# Patient Record
Sex: Female | Born: 1995 | Hispanic: Yes | Marital: Married | Smoking: Never smoker
Health system: Southern US, Community
[De-identification: ages and names within clinical notes are randomized; demographics above are authoritative.]

## PROBLEM LIST (undated history)

## (undated) ENCOUNTER — Emergency Department (HOSPITAL_COMMUNITY): Payer: Medicaid Other

## (undated) DIAGNOSIS — T8859XA Other complications of anesthesia, initial encounter: Secondary | ICD-10-CM

## (undated) DIAGNOSIS — T4145XA Adverse effect of unspecified anesthetic, initial encounter: Secondary | ICD-10-CM

## (undated) DIAGNOSIS — Z8759 Personal history of other complications of pregnancy, childbirth and the puerperium: Secondary | ICD-10-CM

---
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
## 2016-07-12 DIAGNOSIS — Z3046 Encounter for surveillance of implantable subdermal contraceptive: Secondary | ICD-10-CM | POA: Diagnosis not present

## 2016-07-12 DIAGNOSIS — Z113 Encounter for screening for infections with a predominantly sexual mode of transmission: Secondary | ICD-10-CM | POA: Diagnosis not present

## 2016-07-12 DIAGNOSIS — Z30013 Encounter for initial prescription of injectable contraceptive: Secondary | ICD-10-CM | POA: Diagnosis not present

## 2017-03-24 ENCOUNTER — Encounter (HOSPITAL_COMMUNITY): Payer: Self-pay | Admitting: Emergency Medicine

## 2017-03-24 ENCOUNTER — Ambulatory Visit (HOSPITAL_COMMUNITY)
Admission: EM | Admit: 2017-03-24 | Discharge: 2017-03-24 | Disposition: A | Discharge status: Home / Self Care | Payer: Self-pay | Attending: Family Medicine | Admitting: Family Medicine

## 2017-03-24 DIAGNOSIS — H9201 Otalgia, right ear: Secondary | ICD-10-CM

## 2017-03-24 DIAGNOSIS — J029 Acute pharyngitis, unspecified: Secondary | ICD-10-CM | POA: Insufficient documentation

## 2017-03-24 LAB — POCT RAPID STREP A: STREPTOCOCCUS, GROUP A SCREEN (DIRECT): NEGATIVE

## 2017-03-24 MED ORDER — FLUTICASONE PROPIONATE 50 MCG/ACT NA SUSP: 2.0000 | Freq: Every day | NASAL | 0 refills | Status: DC

## 2017-03-24 MED ORDER — PHENOL 1.4 % MT LIQD
1.0000 | OROMUCOSAL | 0 refills | Status: DC | PRN
Start: 2017-03-24 — End: 2019-10-06

## 2017-03-24 MED ORDER — CETIRIZINE-PSEUDOEPHEDRINE ER 5-120 MG PO TB12: 1.0000 | ORAL_TABLET | Freq: Every day | ORAL | 0 refills | Status: DC

## 2017-03-24 NOTE — ED Provider Notes (Signed)
[facility]    CSN: [id number] Arrival date & time: 03/24/17  1458     History   Chief Complaint Chief Complaint  Patient presents with  . Sore Throat  . Otalgia    HPI Janet Rivers is a 22 y.o. female.   22 year old female comes in for 2-week history of sore throat and intermittent right ear pain. States sore throat is intermittent, worse with eating, worse first thing in the morning. Denies rhinorrhea, nasal congestion, cough. Denies fever, chills, night sweats. otc cold medications with some relief of ear pain. Ibuprofen without relief. Never smoker, denies sick contact.       History reviewed. No pertinent past medical history.  There are no active problems to display for this patient.   Past Surgical History:  Procedure Laterality Date  . CESAREAN SECTION      OB History    No data available       Home Medications    Prior to Admission medications   Medication Sig Start Date End Date Taking? Authorizing Provider  cetirizine-pseudoephedrine (ZYRTEC-D) 5-120 MG tablet Take 1 tablet by mouth daily. 03/24/17   Cathie HoopsYu, Kari Kerth V, PA-C  fluticasone (FLONASE) 50 MCG/ACT nasal spray Place 2 sprays into both nostrils daily. 03/24/17   Cathie HoopsYu, Elizeth Weinrich V, PA-C  phenol (CHLORASEPTIC) 1.4 % LIQD Use as directed 1 spray in the mouth or throat as needed for throat irritation / pain. 03/24/17   Belinda FisherYu, Karlen Barbar V, PA-C    Family History No family history on file.  Social History Social History   Tobacco Use  . Smoking status: Never Smoker  . Smokeless tobacco: Never Used  Substance Use Topics  . Alcohol use: Yes    Comment: occ.   . Drug use: No     Allergies   Patient has no known allergies.   Review of Systems Review of Systems  Reason unable to perform ROS: See HPI as above.     Physical Exam Triage Vital Signs ED Triage Vitals [03/24/17 1545]  Enc Vitals Group     BP 104/65     Pulse Rate 92     Resp 16     Temp 98.6 F (37 C)     Temp Source Temporal   SpO2 98 %     Weight 120 lb (54.4 kg)     Height 5\' 4"  (1.626 m)     Head Circumference      Peak Flow      Pain Score 0     Pain Loc      Pain Edu?      Excl. in GC?    No data found.  Updated Vital Signs BP 104/65   Pulse 92   Temp 98.6 F (37 C) (Temporal)   Resp 16   Ht 5\' 4"  (1.626 m)   Wt 120 lb (54.4 kg)   LMP 03/10/2017   SpO2 98%   BMI 20.60 kg/m   Physical Exam  Constitutional: She is oriented to person, place, and time. She appears well-developed and well-nourished. No distress.  HENT:  Head: Normocephalic and atraumatic.  Right Ear: External ear and ear canal normal. Tympanic membrane is erythematous. Tympanic membrane is not bulging.  Left Ear: External ear and ear canal normal. Tympanic membrane is erythematous. Tympanic membrane is not bulging.  Nose: Nose normal. Right sinus exhibits no maxillary sinus tenderness and no frontal sinus tenderness. Left sinus exhibits no maxillary sinus tenderness and no frontal sinus tenderness.  Mouth/Throat:  Uvula is midline and mucous membranes are normal. Posterior oropharyngeal erythema present. Tonsils are 1+ on the right. Tonsils are 1+ on the left. No tonsillar exudate.  Eyes: Conjunctivae are normal. Pupils are equal, round, and reactive to light.  Neck: Normal range of motion. Neck supple.  Cardiovascular: Normal rate, regular rhythm and normal heart sounds. Exam reveals no gallop and no friction rub.  No murmur heard. Pulmonary/Chest: Effort normal and breath sounds normal. She has no decreased breath sounds. She has no wheezes. She has no rhonchi. She has no rales.  Lymphadenopathy:    She has no cervical adenopathy.  Neurological: She is alert and oriented to person, place, and time.  Skin: Skin is warm and dry.  Psychiatric: She has a normal mood and affect. Her behavior is normal. Judgment normal.     UC Treatments / Results  Labs (all labs ordered are listed, but only abnormal results are displayed) Labs  Reviewed  CULTURE, GROUP A STREP [facility])  POCT RAPID STREP A    EKG  EKG Interpretation None       Radiology No results found.  Procedures Procedures (including critical care time)  Medications Ordered in UC Medications - No data to display   Initial Impression / Assessment and Plan / UC Course  I have reviewed the triage vital signs and the nursing notes.  Pertinent labs & imaging results that were available during my care of the patient were reviewed by me and considered in my medical decision making (see chart for details).    Rapid strep negative. Symptomatic treatment as needed. Return precautions given.   Final Clinical Impressions(s) / UC Diagnoses   Final diagnoses:  Pharyngitis, unspecified etiology    ED Discharge Orders        Ordered    fluticasone (FLONASE) 50 MCG/ACT nasal spray  Daily     03/24/17 1712    cetirizine-pseudoephedrine (ZYRTEC-D) 5-120 MG tablet  Daily     03/24/17 1712    phenol (CHLORASEPTIC) 1.4 % LIQD  As needed     03/24/17 1712        Belinda Fisher, PA-C 03/24/17 1731

## 2017-03-24 NOTE — ED Triage Notes (Signed)
PT reports sore throat for 2 weeks. PT denies fever.   PT reports intermittent right ear pain for same time period.

## 2017-03-24 NOTE — Discharge Instructions (Signed)
Rapid strep negative. Symptoms are most likely due to viral illness. Start phenol for sore throat. Flonase and/or Zyrtec-D for nasal congestion. You can use over the counter nasal saline rinse such as neti pot for nasal congestion. Monitor for any worsening of symptoms, swelling of the throat, trouble breathing, trouble swallowing, follow up for reevaluation.  ° °For sore throat try using a honey-based tea. Use 3 teaspoons of honey with juice squeezed from half lemon. Place shaved pieces of ginger into 1/2-1 cup of water and warm over stove top. Then mix the ingredients and repeat every 4 hours as needed. ° ° °

## 2017-03-27 LAB — CULTURE, GROUP A STREP (THRC)

## 2018-08-17 HISTORY — PX: COSMETIC SURGERY: SHX468

## 2019-03-19 HISTORY — DX: Maternal care for unspecified type scar from previous cesarean delivery: O34.219

## 2019-05-19 ENCOUNTER — Inpatient Hospital Stay (HOSPITAL_COMMUNITY)
Admission: AD | Admit: 2019-05-19 | Discharge: 2019-05-20 | Disposition: A | Discharge status: Home / Self Care | Payer: Medicaid Other | Attending: Obstetrics and Gynecology | Admitting: Obstetrics and Gynecology

## 2019-05-19 ENCOUNTER — Encounter (HOSPITAL_COMMUNITY): Payer: Self-pay | Admitting: *Deleted

## 2019-05-19 ENCOUNTER — Other Ambulatory Visit: Payer: Self-pay

## 2019-05-19 DIAGNOSIS — Z6791 Unspecified blood type, Rh negative: Secondary | ICD-10-CM | POA: Insufficient documentation

## 2019-05-19 DIAGNOSIS — Z3A09 9 weeks gestation of pregnancy: Secondary | ICD-10-CM | POA: Insufficient documentation

## 2019-05-19 DIAGNOSIS — O3680X0 Pregnancy with inconclusive fetal viability, not applicable or unspecified: Secondary | ICD-10-CM

## 2019-05-19 DIAGNOSIS — Z3A00 Weeks of gestation of pregnancy not specified: Secondary | ICD-10-CM | POA: Diagnosis not present

## 2019-05-19 DIAGNOSIS — O209 Hemorrhage in early pregnancy, unspecified: Secondary | ICD-10-CM | POA: Diagnosis not present

## 2019-05-19 DIAGNOSIS — O26891 Other specified pregnancy related conditions, first trimester: Secondary | ICD-10-CM | POA: Insufficient documentation

## 2019-05-19 DIAGNOSIS — O208 Other hemorrhage in early pregnancy: Secondary | ICD-10-CM | POA: Insufficient documentation

## 2019-05-19 MED ORDER — SODIUM CHLORIDE 0.9% FLUSH: 3.0000 mL | Freq: Once | INTRAVENOUS | Status: DC

## 2019-05-19 NOTE — ED Triage Notes (Signed)
THE PT IS C/O VAGINAL BLEEDING THAT STARTED 2 DAYS AGO  NO PAIN  LMP DEC 27TH 2ND PREGNANCY NO PRENATAL CARE YET

## 2019-05-20 ENCOUNTER — Encounter (HOSPITAL_COMMUNITY): Payer: Self-pay | Admitting: *Deleted

## 2019-05-20 ENCOUNTER — Inpatient Hospital Stay (HOSPITAL_COMMUNITY): Payer: Medicaid Other

## 2019-05-20 DIAGNOSIS — O209 Hemorrhage in early pregnancy, unspecified: Secondary | ICD-10-CM | POA: Diagnosis not present

## 2019-05-20 DIAGNOSIS — O3680X0 Pregnancy with inconclusive fetal viability, not applicable or unspecified: Secondary | ICD-10-CM | POA: Diagnosis not present

## 2019-05-20 DIAGNOSIS — Z3A00 Weeks of gestation of pregnancy not specified: Secondary | ICD-10-CM | POA: Diagnosis not present

## 2019-05-20 LAB — CBC
HCT: 38.4 % (ref 36.0–46.0)
Hemoglobin: 12.5 g/dL (ref 12.0–15.0)
MCH: 31.6 pg (ref 26.0–34.0)
MCHC: 32.6 g/dL (ref 30.0–36.0)
MCV: 97 fL (ref 80.0–100.0)
Platelets: 267 10*3/uL (ref 150–400)
RBC: 3.96 MIL/uL (ref 3.87–5.11)
RDW: 12.4 % (ref 11.5–15.5)
WBC: 8.5 10*3/uL (ref 4.0–10.5)
nRBC: 0 % (ref 0.0–0.2)

## 2019-05-20 LAB — COMPREHENSIVE METABOLIC PANEL
ALT: 37 U/L (ref 0–44)
AST: 29 U/L (ref 15–41)
Albumin: 4.3 g/dL (ref 3.5–5.0)
Alkaline Phosphatase: 65 U/L (ref 38–126)
Anion gap: 9 (ref 5–15)
BUN: 14 mg/dL (ref 6–20)
CO2: 24 mmol/L (ref 22–32)
Calcium: 9 mg/dL (ref 8.9–10.3)
Chloride: 104 mmol/L (ref 98–111)
Creatinine, Ser: 0.58 mg/dL (ref 0.44–1.00)
GFR calc Af Amer: >60 mL/min (ref >60)
GFR calc non Af Amer: >60 mL/min (ref >60)
Glucose, Bld: 97 mg/dL (ref 70–99)
Potassium: 3.6 mmol/L (ref 3.5–5.1)
Sodium: 137 mmol/L (ref 135–145)
Total Bilirubin: 0.5 mg/dL (ref 0.3–1.2)
Total Protein: 7 g/dL (ref 6.5–8.1)

## 2019-05-20 LAB — URINALYSIS, ROUTINE W REFLEX MICROSCOPIC
Bilirubin Urine: NEGATIVE
Glucose, UA: NEGATIVE mg/dL
Ketones, ur: NEGATIVE mg/dL
Leukocytes,Ua: NEGATIVE
Nitrite: NEGATIVE
Protein, ur: NEGATIVE mg/dL
RBC / HPF: >50 RBC/hpf — ABNORMAL HIGH (ref 0–5)
Specific Gravity, Urine: 1.025 (ref 1.005–1.030)
pH: 5 (ref 5.0–8.0)

## 2019-05-20 LAB — ABO/RH
ABO/RH(D): O NEG
Antibody Screen: NEGATIVE

## 2019-05-20 LAB — WET PREP, GENITAL
Sperm: NONE SEEN
Trich, Wet Prep: NONE SEEN
Yeast Wet Prep HPF POC: NONE SEEN

## 2019-05-20 LAB — GC/CHLAMYDIA PROBE AMP (~~LOC~~) NOT AT ARMC
Chlamydia: NEGATIVE
Comment: NEGATIVE
Comment: NORMAL
Neisseria Gonorrhea: NEGATIVE

## 2019-05-20 LAB — HCG, QUANTITATIVE, PREGNANCY: hCG, Beta Chain, Quant, S: 5668 m[IU]/mL — ABNORMAL HIGH (ref <5)

## 2019-05-20 LAB — I-STAT BETA HCG BLOOD, ED (MC, WL, AP ONLY): I-stat hCG, quantitative: >2000 m[IU]/mL — ABNORMAL HIGH (ref <5)

## 2019-05-20 LAB — HIV ANTIBODY (ROUTINE TESTING W REFLEX): HIV Screen 4th Generation wRfx: NONREACTIVE

## 2019-05-20 LAB — LIPASE, BLOOD: Lipase: 28 U/L (ref 11–51)

## 2019-05-20 MED ORDER — RHO D IMMUNE GLOBULIN 1500 UNIT/2ML IJ SOSY
300.0000 ug | PREFILLED_SYRINGE | Freq: Once | INTRAMUSCULAR | Status: AC
  Administered 2019-05-20: 300 ug via INTRAMUSCULAR
  Filled 2019-05-20: qty 2

## 2019-05-20 NOTE — ED Provider Notes (Signed)
MSE was initiated and I personally evaluated the patient and placed orders (if any) at  1:27 AM on May 20, 2019.  Patient states she is approximately [redacted] weeks pregnant.  She started to have bleeding 2 days ago, has been constant for the past 2 days.  She denies abdominal pain.  She has no other medical problems, takes only prenatals daily.  PE:  Gen: appears nontoxic Abs: soft nontender  Patient is pregnant with vaginal bleeding.  Stable at this time. + UPT.  Discussed with Dr. Jolayne Panther from OB/GYN who agrees that patient can be transferred to the MAU.  The patient appears stable so that the remainder of the MSE may be completed by another provider.   Alveria Apley, PA-C 05/20/19 0128    Mesner, Barbara Cower, MD 05/20/19 [phone]

## 2019-05-20 NOTE — MAU Note (Signed)
Vag bleeding for 2 days. Started out pink but yesterday was alittle more and red. Only see the blood when I wipe. Occ upper back pain

## 2019-05-20 NOTE — MAU Provider Note (Signed)
Chief Complaint: Vaginal Bleeding   First Provider Initiated Contact with Patient 05/20/19 0225        SUBJECTIVE HPI: Janet Rivers is a 24 y.o. G2P1 at [redacted]w[redacted]d by LMP who presents to maternity admissions reporting vaginal bleeding for 2 days.  Initially pink, now more red. No pain. She denies vaginal itching/burning, urinary symptoms, h/a, dizziness, n/v, or fever/chills.     Vaginal Bleeding The patient's primary symptoms include vaginal bleeding. The patient's pertinent negatives include no genital itching, genital lesions, genital odor or pelvic pain. This is a new problem. The current episode started in the past 7 days. The problem occurs intermittently. The problem has been unchanged. The patient is experiencing no pain. Associated symptoms include back pain (upper back occasional). Pertinent negatives include no abdominal pain, constipation, diarrhea, fever, nausea or vomiting. The vaginal discharge was bloody. The vaginal bleeding is spotting. She has not been passing clots. She has not been passing tissue. Nothing aggravates the symptoms. She has tried nothing for the symptoms.   RN Note: Vag bleeding for 2 days. Started out pink but yesterday was alittle more and red. Only see the blood when I wipe. Occ upper back pain   History reviewed. No pertinent past medical history. Past Surgical History:  Procedure Laterality Date  . CESAREAN SECTION     Social History   Socioeconomic History  . Marital status: Married    Spouse name: Not on file  . Number of children: Not on file  . Years of education: Not on file  . Highest education level: Not on file  Occupational History  . Not on file  Tobacco Use  . Smoking status: Never Smoker  . Smokeless tobacco: Never Used  Substance and Sexual Activity  . Alcohol use: Yes    Comment: occ.   . Drug use: No  . Sexual activity: Not on file  Other Topics Concern  . Not on file  Social History Narrative  . Not on file   Social  Determinants of Health   Financial Resource Strain:   . Difficulty of Paying Living Expenses: Not on file  Food Insecurity:   . Worried About Charity fundraiser in the Last Year: Not on file  . Ran Out of Food in the Last Year: Not on file  Transportation Needs:   . Lack of Transportation (Medical): Not on file  . Lack of Transportation (Non-Medical): Not on file  Physical Activity:   . Days of Exercise per Week: Not on file  . Minutes of Exercise per Session: Not on file  Stress:   . Feeling of Stress : Not on file  Social Connections:   . Frequency of Communication with Friends and Family: Not on file  . Frequency of Social Gatherings with Friends and Family: Not on file  . Attends Religious Services: Not on file  . Active Member of Clubs or Organizations: Not on file  . Attends Archivist Meetings: Not on file  . Marital Status: Not on file  Intimate Partner Violence:   . Fear of Current or Ex-Partner: Not on file  . Emotionally Abused: Not on file  . Physically Abused: Not on file  . Sexually Abused: Not on file   No current facility-administered medications on file prior to encounter.   Current Outpatient Medications on File Prior to Encounter  Medication Sig Dispense Refill  . cetirizine-pseudoephedrine (ZYRTEC-D) 5-120 MG tablet Take 1 tablet by mouth daily. 15 tablet 0  . fluticasone (FLONASE)  50 MCG/ACT nasal spray Place 2 sprays into both nostrils daily. 1 g 0  . phenol (CHLORASEPTIC) 1.4 % LIQD Use as directed 1 spray in the mouth or throat as needed for throat irritation / pain. 1 Bottle 0   No Known Allergies  I have reviewed patient's Past Medical Hx, Surgical Hx, Family Hx, Social Hx, medications and allergies.   ROS:  Review of Systems  Constitutional: Negative for fever.  Gastrointestinal: Negative for abdominal pain, constipation, diarrhea, nausea and vomiting.  Genitourinary: Positive for vaginal bleeding. Negative for pelvic pain.   Musculoskeletal: Positive for back pain (upper back occasional).   Review of Systems  Other systems negative   Physical Exam  Physical Exam Patient Vitals for the past 24 hrs:  BP Temp Temp src Pulse Resp SpO2 Height Weight  05/20/19 0200 (!) 108/59 98.3 F (36.8 C) -- 78 18 -- 5\' 4"  (1.626 m) 61.7 kg  05/19/19 2340 -- -- -- -- -- -- -- 54.4 kg  05/19/19 2335 105/70 98.5 F (36.9 C) Oral 96 20 100 % -- --   Constitutional: Well-developed, well-nourished female in no acute distress.  Cardiovascular: normal rate Respiratory: normal effort GI: Abd soft, non-tender. Pos BS x 4 MS: Extremities nontender, no edema, normal ROM Neurologic: Alert and oriented x 4.  GU: Neg CVAT.  PELVIC EXAM: Cervix pink, visually closed, without lesion, scant dark red discharge, vaginal walls and external genitalia normal Bimanual exam: Cervix 0/long/high, firm, anterior, neg CMT, uterus nontender, nonenlarged, adnexa without tenderness, enlargement, or mass   LAB RESULTS Results for orders placed or performed during the hospital encounter of 05/19/19 (from the past 24 hour(s))  Urinalysis, Routine w reflex microscopic     Status: Abnormal   Collection Time: 05/19/19 11:40 PM  Result Value Ref Range   Color, Urine YELLOW YELLOW   APPearance CLEAR CLEAR   Specific Gravity, Urine 1.025 1.005 - 1.030   pH 5.0 5.0 - 8.0   Glucose, UA NEGATIVE NEGATIVE mg/dL   Hgb urine dipstick LARGE (A) NEGATIVE   Bilirubin Urine NEGATIVE NEGATIVE   Ketones, ur NEGATIVE NEGATIVE mg/dL   Protein, ur NEGATIVE NEGATIVE mg/dL   Nitrite NEGATIVE NEGATIVE   Leukocytes,Ua NEGATIVE NEGATIVE   RBC / HPF >50 (H) 0 - 5 RBC/hpf   WBC, UA 6-10 0 - 5 WBC/hpf   Bacteria, UA FEW (A) NONE SEEN   Squamous Epithelial / LPF 0-5 0 - 5   Mucus PRESENT   Lipase, blood     Status: None   Collection Time: 05/19/19 11:58 PM  Result Value Ref Range   Lipase 28 11 - 51 U/L  Comprehensive metabolic panel     Status: None   Collection  Time: 05/19/19 11:58 PM  Result Value Ref Range   Sodium 137 135 - 145 mmol/L   Potassium 3.6 3.5 - 5.1 mmol/L   Chloride 104 98 - 111 mmol/L   CO2 24 22 - 32 mmol/L   Glucose, Bld 97 70 - 99 mg/dL   BUN 14 6 - 20 mg/dL   Creatinine, Ser 07/19/19 0.44 - 1.00 mg/dL   Calcium 9.0 8.9 - 0.86 mg/dL   Total Protein 7.0 6.5 - 8.1 g/dL   Albumin 4.3 3.5 - 5.0 g/dL   AST 29 15 - 41 U/L   ALT 37 0 - 44 U/L   Alkaline Phosphatase 65 38 - 126 U/L   Total Bilirubin 0.5 0.3 - 1.2 mg/dL   GFR calc non Af Amer >60 >  60 mL/min   GFR calc Af Amer >60 >60 mL/min   Anion gap 9 5 - 15  CBC     Status: None   Collection Time: 05/19/19 11:58 PM  Result Value Ref Range   WBC 8.5 4.0 - 10.5 K/uL   RBC 3.96 3.87 - 5.11 MIL/uL   Hemoglobin 12.5 12.0 - 15.0 g/dL   HCT 73.5 32.9 - 92.4 %   MCV 97.0 80.0 - 100.0 fL   MCH 31.6 26.0 - 34.0 pg   MCHC 32.6 30.0 - 36.0 g/dL   RDW 26.8 34.1 - 96.2 %   Platelets 267 150 - 400 K/uL   nRBC 0.0 0.0 - 0.2 %  I-Stat beta hCG blood, ED     Status: Abnormal   Collection Time: 05/20/19 12:19 AM  Result Value Ref Range   I-stat hCG, quantitative >2,000.0 (H) <5 mIU/mL   Comment 3          Wet prep, genital     Status: Abnormal   Collection Time: 05/20/19  2:46 AM  Result Value Ref Range   Yeast Wet Prep HPF POC NONE SEEN NONE SEEN   Trich, Wet Prep NONE SEEN NONE SEEN   Clue Cells Wet Prep HPF POC PRESENT (A) NONE SEEN   WBC, Wet Prep HPF POC MANY (A) NONE SEEN   Sperm NONE SEEN   ABO/Rh     Status: None   Collection Time: 05/20/19  3:09 AM  Result Value Ref Range   ABO/RH(D) O NEG    Antibody Screen      NEG Performed at [facility], [address] [place], [place] [address]   hCG, quantitative, pregnancy     Status: Abnormal   Collection Time: 05/20/19  3:09 AM  Result Value Ref Range   hCG, Beta Chain, Quant, S 5,668 (H) <5 mIU/mL  Rh IG workup (includes ABO/Rh)     Status: None (Preliminary result)   Collection Time: 05/20/19  3:47 AM  Result  Value Ref Range   Gestational Age(Wks) 6    ABO/RH(D) O NEG    Unit Number [id number]    Blood Component Type RHIG    Unit division 00    Status of Unit ISSUED    Transfusion Status      OK TO TRANSFUSE Performed at [facility], [address], [place], [place] [address]      IMAGING US OB LESS THAN 14 WEEKS WITH OB TRANSVAGINAL  Result Date: 05/20/2019 CLINICAL DATA:  Bleeding EXAM: OBSTETRIC <14 WK [place] AND TRANSVAGINAL OB US TECHNIQUE: Both transabdominal and transvaginal ultrasound examinations were performed for complete evaluation of the gestation as well as the maternal uterus, adnexal regions, and pelvic cul-de-sac. Transvaginal technique was performed to assess early pregnancy. COMPARISON:  None. FINDINGS: Intrauterine gestational sac: Single Yolk sac:  Not Visualized. Embryo:  Not Visualized. MSD: 17.3  mm   6 w   4 d Subchorionic hemorrhage: There is a small cystic area adjacent to the gestational sac which could represent a small subchorionic hemorrhage. Maternal uterus/adnexae: Normal appearing ovaries. IMPRESSION: Probable early intrauterine gestational sac, but no yolk sac, fetal pole, or cardiac activity yet visualized. Recommend follow-up quantitative B-HCG levels and follow-up US in 14 days to assess viability. This recommendation follows SRU consensus guidelines: Diagnostic Criteria for Nonviable Pregnancy Early in the First Trimester. Malva Limes Med 2013; 563:1497-02. Electronically Signed   By: Jonna Clark M.D.   On: 05/20/2019 03:45  MAU Management/MDM: Ordered usual first trimester r/o ectopic labs.   Pelvic exam and cultures done Will check baseline Ultrasound to rule out ectopic.  This bleeding/pain can represent a normal pregnancy with bleeding, spontaneous abortion or even an ectopic which can be life-threatening.  The process as listed above helps to determine which of these is present.  Reviewed US findings with patient Recommend repeat HCG on Saturday  morning  Also recommend Rhophylac today.  Will need another dose at 28 wks and PP   ASSESSMENT Pregnancy at 100w4d by LMP Bleeding in early pregnancy Gest Sac with no yolk sac, therefore, pregnancy of unknown location Rh Negative  PLAN Discharge home Plan to repeat HCG level in 48 hours in MAU   Will repeat  Ultrasound in about 7-10 days if HCG levels double appropriately  Ectopic precautions Rhophylac today  Pt stable at time of discharge. Encouraged to return here or to other Urgent Care/ED if she develops worsening of symptoms, increase in pain, fever, or other concerning symptoms.    Wynelle Bourgeois CNM, MSN Certified Nurse-Midwife 05/20/2019  2:25 AM

## 2019-05-20 NOTE — Discharge Instructions (Signed)
Vaginal Bleeding During Pregnancy, First Trimester  A small amount of bleeding from the vagina (spotting) is relatively common during early pregnancy. It usually stops on its own. Various things may cause bleeding or spotting during early pregnancy. Some bleeding may be related to the pregnancy, and some may not. In many cases, the bleeding is normal and is not a problem. However, bleeding can also be a sign of something serious. Be sure to tell your health care provider about any vaginal bleeding right away. Some possible causes of vaginal bleeding during the first trimester include:  Infection or inflammation of the cervix.  Growths (polyps) on the cervix.  Miscarriage or threatened miscarriage.  Pregnancy tissue developing outside of the uterus (ectopic pregnancy).  A mass of tissue developing in the uterus due to an egg being fertilized incorrectly (molar pregnancy). Follow these instructions at home: Activity  Follow instructions from your health care provider about limiting your activity. Ask what activities are safe for you.  If needed, make plans for someone to help with your regular activities.  Do not have sex or orgasms until your health care provider says that this is safe. General instructions  Take over-the-counter and prescription medicines only as told by your health care provider.  Pay attention to any changes in your symptoms.  Do not use tampons or douche.  Write down how many pads you use each day, how often you change pads, and how soaked (saturated) they are.  If you pass any tissue from your vagina, save the tissue so you can show it to your health care provider.  Keep all follow-up visits as told by your health care provider. This is important. Contact a health care provider if:  You have vaginal bleeding during any part of your pregnancy.  You have cramps or labor pains.  You have a fever. Get help right away if:  You have severe cramps in your  back or abdomen.  You pass large clots or a large amount of tissue from your vagina.  Your bleeding increases.  You feel light-headed or weak, or you faint.  You have chills.  You are leaking fluid or have a gush of fluid from your vagina. Summary  A small amount of bleeding (spotting) from the vagina is relatively common during early pregnancy.  Various things may cause bleeding or spotting in early pregnancy.  Be sure to tell your health care provider about any vaginal bleeding right away. This information is not intended to replace advice given to you by your health care provider. Make sure you discuss any questions you have with your health care provider. Document Revised: 06/23/2018 Document Reviewed: 06/06/2016 Elsevier Patient Education  2020 [place] for [place] at [facility]       Phone: [phone]  [place] for [employer] at [place]   Phone: [place] for [employer] at [place]  Phone: [place] for [place] at [facility]  Phone: [place] for [place] at [place]  Phone: [place] for [place] at [facility]   Phone: [facility]       Phone: [phone]  [facility]    Phone: [facility]    Phone: [phone]  [facility]    Phone: [phone]  [place]    Phone: [phone]  [facility]       Phone: [phone]   [facility]  Phone: [phone]  [facility]    Phone: [phone]  [facility]   Phone: [phone]  [facility]      Phone: [phone]  [facility]    Phone: [phone]

## 2019-05-21 ENCOUNTER — Other Ambulatory Visit: Payer: Self-pay

## 2019-05-21 ENCOUNTER — Inpatient Hospital Stay (HOSPITAL_COMMUNITY)
Admission: AD | Admit: 2019-05-21 | Discharge: 2019-05-21 | Disposition: A | Discharge status: Home / Self Care | Payer: Medicaid Other | Attending: Obstetrics & Gynecology | Admitting: Obstetrics & Gynecology

## 2019-05-21 ENCOUNTER — Inpatient Hospital Stay (HOSPITAL_COMMUNITY): Payer: Medicaid Other

## 2019-05-21 ENCOUNTER — Encounter (HOSPITAL_COMMUNITY): Payer: Self-pay | Admitting: Obstetrics & Gynecology

## 2019-05-21 DIAGNOSIS — O200 Threatened abortion: Secondary | ICD-10-CM | POA: Insufficient documentation

## 2019-05-21 DIAGNOSIS — Z3A09 9 weeks gestation of pregnancy: Secondary | ICD-10-CM | POA: Diagnosis not present

## 2019-05-21 DIAGNOSIS — R109 Unspecified abdominal pain: Secondary | ICD-10-CM | POA: Insufficient documentation

## 2019-05-21 DIAGNOSIS — O209 Hemorrhage in early pregnancy, unspecified: Secondary | ICD-10-CM | POA: Diagnosis not present

## 2019-05-21 DIAGNOSIS — N939 Abnormal uterine and vaginal bleeding, unspecified: Secondary | ICD-10-CM

## 2019-05-21 DIAGNOSIS — O26899 Other specified pregnancy related conditions, unspecified trimester: Secondary | ICD-10-CM

## 2019-05-21 HISTORY — DX: Other complications of anesthesia, initial encounter: T88.59XA

## 2019-05-21 LAB — URINALYSIS, ROUTINE W REFLEX MICROSCOPIC
Bilirubin Urine: NEGATIVE
Glucose, UA: NEGATIVE mg/dL
Ketones, ur: NEGATIVE mg/dL
Leukocytes,Ua: NEGATIVE
Nitrite: NEGATIVE
Protein, ur: 30 mg/dL — AB
RBC / HPF: >50 RBC/hpf — ABNORMAL HIGH (ref 0–5)
Specific Gravity, Urine: 1.02 (ref 1.005–1.030)
pH: 6 (ref 5.0–8.0)

## 2019-05-21 LAB — RH IG WORKUP (INCLUDES ABO/RH)
ABO/RH(D): O NEG
Gestational Age(Wks): 6
Unit division: 0

## 2019-05-21 LAB — CBC
HCT: 37 % (ref 36.0–46.0)
Hemoglobin: 12.5 g/dL (ref 12.0–15.0)
MCH: 32 pg (ref 26.0–34.0)
MCHC: 33.8 g/dL (ref 30.0–36.0)
MCV: 94.6 fL (ref 80.0–100.0)
Platelets: 236 10*3/uL (ref 150–400)
RBC: 3.91 MIL/uL (ref 3.87–5.11)
RDW: 12.3 % (ref 11.5–15.5)
WBC: 11.8 10*3/uL — ABNORMAL HIGH (ref 4.0–10.5)
nRBC: 0 % (ref 0.0–0.2)

## 2019-05-21 LAB — HCG, QUANTITATIVE, PREGNANCY: hCG, Beta Chain, Quant, S: 3494 m[IU]/mL — ABNORMAL HIGH (ref <5)

## 2019-05-21 MED ORDER — OXYCODONE-ACETAMINOPHEN 5-325 MG PO TABS
1.0000 | ORAL_TABLET | Freq: Once | ORAL | Status: AC
  Administered 2019-05-21: 1 via ORAL
  Filled 2019-05-21: qty 1

## 2019-05-21 NOTE — MAU Note (Signed)
Was seen in MAU last night for vaginal bleeding, no pain.  Supposed to f/u Saturday for bloodwork, but bleeding increased, now passing clots and feeling abdominal pain.

## 2019-05-21 NOTE — Discharge Instructions (Signed)
Threatened Miscarriage  A threatened miscarriage is when you have bleeding from your vagina during the first 20 weeks of pregnancy but the pregnancy does not end. Your doctor will do tests to make sure you are still pregnant. The cause of the bleeding may not be known. This condition does not mean your pregnancy will end, but it does increase the risk that it will end (complete miscarriage). Follow these instructions at home:  Get plenty of rest.  If you have bleeding in your vagina, do not have sex or use tampons.  Do not douche.  Do not smoke or use drugs.  Do not drink alcohol.  Avoid caffeine.  Keep all follow-up prenatal visits as told by your doctor. This is important. Contact a doctor if:  You have light bleeding from your vagina.  You have belly pain or cramping.  You have a fever. Get help right away if:  You have heavy bleeding from your vagina.  You have clots of blood coming from your vagina.  You pass tissue from your vagina.  You have a gush of fluid from your vagina.  You are leaking fluid from your vagina.  You have very bad pain or cramps in your low back or belly.  You have fever, chills, and very bad belly pain. Summary  A threatened miscarriage is when you have bleeding from your vagina during the first 20 weeks of pregnancy but the pregnancy does not end.  This condition does not mean your pregnancy will end, but it does increase the risk that it will end (complete miscarriage).  Get plenty of rest. If you have bleeding in your vagina, do not have sex or use tampons.  Keep all follow-up prenatal visits as told by your doctor. This is important. This information is not intended to replace advice given to you by your health care provider. Make sure you discuss any questions you have with your health care provider. Document Revised: 04/10/2017 Document Reviewed: 05/31/2016 Elsevier Patient Education  2020 [employer] Inc[employer]  

## 2019-05-21 NOTE — MAU Provider Note (Signed)
History     CSN: [id number]  Arrival date and time: 05/21/19 3716   First Provider Initiated Contact with Patient 05/21/19 [phone]     Chief Complaint  Patient presents with  . Vaginal Bleeding  . Abdominal Pain   HPI Janet Rivers is a 24 y.o. G2P1001 at [redacted]w[redacted]d who presents to MAU for evaluation of worsening abdominal pain and heavy vaginal bleeding. Her abdominal pain is new onset this morning. She rates her pain as 4-6/10. Her pain radiates to her lower back. She has not taken medication or tried other treatments for this complaint.   Patient was seen in MAU for vaginal bleeding overnight on 03/03-03/04. She states her bleeding became significantly heavier this morning. She denies abdominal tenderness, dizziness, syncope, fever or recent illness. Most recent intercourse four days ago.  OB History    Gravida  2   Para  1   Term  1   Preterm  0   AB  0   Living  1     SAB  0   TAB  0   Ectopic  0   Multiple  0   Live Births  1           Past Medical History:  Diagnosis Date  . 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 CESAREAN SECTION    . COSMETIC SURGERY  08/2018   was going to have fat taken from belly to buttocks, but heart stopped x4 min with anesthesia, surgery in [place].    History reviewed. No pertinent family history.  Social History   Tobacco Use  . Smoking status: Never Smoker  . Smokeless tobacco: Never Used  Substance Use Topics  . Alcohol use: Not Currently    Comment: occ.   . Drug use: No    Allergies: No Known Allergies  Medications Prior to Admission  Medication Sig Dispense Refill Last Dose  . cetirizine-pseudoephedrine (ZYRTEC-D) 5-120 MG tablet Take 1 tablet by mouth daily. 15 tablet 0   . fluticasone (FLONASE) 50 MCG/ACT nasal spray Place 2 sprays into both nostrils daily. 1 g 0   . phenol  (CHLORASEPTIC) 1.4 % LIQD Use as directed 1 spray in the mouth or throat as needed for throat irritation / pain. 1 Bottle 0   . Prenatal Vit-Fe Fumarate-FA (MULTIVITAMIN-PRENATAL) 27-0.8 MG TABS tablet Take 1 tablet by mouth daily at 12 noon.       Review of Systems  Constitutional: Negative for chills, fatigue and fever.  Respiratory: Negative for shortness of breath.   Gastrointestinal: Positive for abdominal pain.  Genitourinary: Positive for vaginal bleeding. Negative for dysuria.  Musculoskeletal: Positive for back pain.  Neurological: Negative for dizziness, syncope, weakness and light-headedness.  All other systems reviewed and are negative.  Physical Exam   Blood pressure 106/62, pulse 92, temperature 98.6 F (37 C), temperature source Oral, resp. rate 16, last menstrual period 03/14/2019.  Physical Exam  Nursing note and vitals reviewed. Constitutional: She is oriented to person, place, and time. She appears well-developed and well-nourished.  Cardiovascular: Normal rate.  Respiratory: Effort normal and breath sounds normal. She has no decreased breath sounds.  GI: Soft. Bowel sounds are normal. She exhibits no distension. There is no abdominal tenderness. There is no rebound, no guarding and no CVA tenderness.  Genitourinary:    Genitourinary Comments: Multiple moderate-sized clots removed  from vagina during speculum exam. No bright red over bleeding visualized. Vagina is pink, moist and well-rugated. Cervix visually closed.    Neurological: She is alert and oriented to person, place, and time.  Skin: Skin is warm and dry.  Psychiatric: She has a normal mood and affect. Her behavior is normal. Judgment and thought content normal.    MAU Course/MDM  Procedures  --Quant hCG 5668 in MAU 05/20/2019 at 0422 --Quant hCG today = 3,494 --Discussed with patient that decreasing quant, heavy bleeding, and change in position of IUGS is concerning for threatened miscarriage --Per  MAU protocol, patient with IUGS without yolk sac or FP/embryo advised to return in 48 hours for repeat Quant hCG (Sunday 05/23/2019)  Patient Vitals for the past 24 hrs:  BP Temp Temp src Pulse Resp  05/21/19 1200 102/71 -- -- (!) 103 16  05/21/19 0932 106/62 98.6 F (37 C) Oral 92 16   Results for orders placed or performed during the hospital encounter of 05/21/19 (from the past 24 hour(s))  Urinalysis, Routine w reflex microscopic     Status: Abnormal   Collection Time: 05/21/19  9:27 AM  Result Value Ref Range   Color, Urine YELLOW YELLOW   APPearance HAZY (A) CLEAR   Specific Gravity, Urine 1.020 1.005 - 1.030   pH 6.0 5.0 - 8.0   Glucose, UA NEGATIVE NEGATIVE mg/dL   Hgb urine dipstick LARGE (A) NEGATIVE   Bilirubin Urine NEGATIVE NEGATIVE   Ketones, ur NEGATIVE NEGATIVE mg/dL   Protein, ur 30 (A) NEGATIVE mg/dL   Nitrite NEGATIVE NEGATIVE   Leukocytes,Ua NEGATIVE NEGATIVE   RBC / HPF >50 (H) 0 - 5 RBC/hpf   WBC, UA 0-5 0 - 5 WBC/hpf   Bacteria, UA FEW (A) NONE SEEN   Mucus PRESENT   CBC     Status: Abnormal   Collection Time: 05/21/19  9:53 AM  Result Value Ref Range   WBC 11.8 (H) 4.0 - 10.5 K/uL   RBC 3.91 3.87 - 5.11 MIL/uL   Hemoglobin 12.5 12.0 - 15.0 g/dL   HCT 37.0 36.0 - 46.0 %   MCV 94.6 80.0 - 100.0 fL   MCH 32.0 26.0 - 34.0 pg   MCHC 33.8 30.0 - 36.0 g/dL   RDW 12.3 11.5 - 15.5 %   Platelets 236 150 - 400 K/uL   nRBC 0.0 0.0 - 0.2 %  hCG, quantitative, pregnancy     Status: Abnormal   Collection Time: 05/21/19  9:53 AM  Result Value Ref Range   hCG, Beta Chain, Quant, S 3,494 (H) <5 mIU/mL   US OB Transvaginal  Result Date: 05/21/2019 CLINICAL DATA:  Bleeding and cramping. History of elevated beta HCG, gestational age by last menstrual period 9 weeks and 5 days, last menstrual period of 03/14/2019 with previous ultrasound showing probable gestational sac in the uterine fundus. EXAM: OBSTETRIC <14 WK ULTRASOUND TECHNIQUE: Transabdominal ultrasound was  performed for evaluation of the gestation as well as the maternal uterus and adnexal regions. COMPARISON:  05/20/2019 FINDINGS: Intrauterine gestational sac: Single potential gestational sac in the lower uterine segment without convincing decidual reaction. Yolk sac:  Not visualized Embryo:  Not visualized MSD:  17.6 mm   6 w   4 d Subchorionic hemorrhage: Small subchorionic hemorrhage suspected along the superior margin of the ovoid potential gestational sac that has migrated from the fundus to the level of the lower uterine segment. This fluid in the lower uterine segment does not show convincing evidence  of decidual reaction, more suggestive on the previous imaging study. Maternal uterus/adnexae: Corpus luteum cyst in the left ovary. Ovaries are otherwise unremarkable. No signs of free fluid. IMPRESSION: Findings are suspicious but not yet definitive for failed pregnancy. The change in position suggests that this may represent abortion in progress. Given that there are no definitive signs of gestation on this or the prior study i.e. fetal pole or yolk sac and no current definitive signs of double decidual sign would recommend continued follow-up with ultrasound in 7-10 days or as indicated and continued correlation with beta HCG. No adnexal mass or free fluid. Electronically Signed   By: Donzetta Kohut M.D.   On: 05/21/2019 11:23   Assessment and Plan  --24 y.o. G2P1001 at [redacted]w[redacted]d  --Threatened miscarriage --O NEG, Rhogam given in MAU yesterday --Discharge home in stable condition with bleeding precautions  F/U: --Repeat Quant hCG in MAU Sunday 05/23/2019  Calvert Cantor, CNM 05/21/2019, 1:55 PM

## 2019-05-23 ENCOUNTER — Other Ambulatory Visit: Payer: Self-pay

## 2019-05-23 ENCOUNTER — Inpatient Hospital Stay (HOSPITAL_COMMUNITY)
Admission: AD | Admit: 2019-05-23 | Discharge: 2019-05-23 | Disposition: A | Discharge status: Home / Self Care | Payer: Medicaid Other | Attending: Family Medicine | Admitting: Family Medicine

## 2019-05-23 DIAGNOSIS — Z3A10 10 weeks gestation of pregnancy: Secondary | ICD-10-CM | POA: Diagnosis not present

## 2019-05-23 DIAGNOSIS — O039 Complete or unspecified spontaneous abortion without complication: Secondary | ICD-10-CM | POA: Diagnosis not present

## 2019-05-23 DIAGNOSIS — O209 Hemorrhage in early pregnancy, unspecified: Secondary | ICD-10-CM | POA: Diagnosis not present

## 2019-05-23 LAB — HCG, QUANTITATIVE, PREGNANCY: hCG, Beta Chain, Quant, S: 822 m[IU]/mL — ABNORMAL HIGH (ref <5)

## 2019-05-23 NOTE — MAU Note (Signed)
Janet Rivers is a 24 y.o. here in MAU reporting:  For follow up blood work. Pain score: denies at present. Does endorse continued vaginal bleeding since previous visit.  Vitals:   05/23/19 1039  BP: (!) 107/55  Pulse: 88  Resp: 16  SpO2: 100%    Lab orders placed from triage: HCG released from sign and held orders.

## 2019-05-23 NOTE — Discharge Instructions (Signed)
Miscarriage A miscarriage is the loss of an unborn baby (fetus) before the 20th week of pregnancy. Follow these instructions at home: Medicines   Take over-the-counter and prescription medicines only as told by your doctor.  If you were prescribed antibiotic medicine, take it as told by your doctor. Do not stop taking the antibiotic even if you start to feel better.  Do not take NSAIDs unless your doctor says that this is safe for you. NSAIDs include aspirin and ibuprofen. These medicines can cause bleeding. Activity  Rest as directed. Ask your doctor what activities are safe for you.  Have someone help you at home during this time. General instructions  Write down how many pads you use each day and how soaked they are.  Watch the amount of tissue or clumps of blood (blood clots) that you pass from your vagina. Save any large amounts of tissue for your doctor.  Do not use tampons, douche, or have sex until your doctor approves.  To help you and your partner with the process of grieving, talk with your doctor or seek counseling.  When you are ready, meet with your doctor to talk about steps you should take for your health. Also, talk with your doctor about steps to take to have a healthy pregnancy in the future.  Keep all follow-up visits as told by your doctor. This is important. Contact a doctor if:  You have a fever or chills.  You have vaginal discharge that smells bad.  You have more bleeding. Get help right away if:  You have very bad cramps or pain in your back or belly.  You pass clumps of blood that are walnut-sized or larger from your vagina.  You pass tissue that is walnut-sized or larger from your vagina.  You soak more than 1 regular pad in an hour.  You get light-headed or weak.  You faint (pass out).  You have feelings of sadness that do not go away, or you have thoughts of hurting yourself. Summary  A miscarriage is the loss of an unborn baby before  the 20th week of pregnancy.  Follow your doctor's instructions for home care. Keep all follow-up appointments.  To help you and your partner with the process of grieving, talk with your doctor or seek counseling. This information is not intended to replace advice given to you by your health care provider. Make sure you discuss any questions you have with your health care provider. Document Revised: 06/26/2018 Document Reviewed: 04/09/2016 Elsevier Patient Education  2020 [employer]vier Inc.  

## 2019-05-23 NOTE — MAU Provider Note (Signed)
Patient Krisa Blattner is a 24 y.o. G2P1001 at [redacted]w[redacted]d here for follow-up bhcg. She was seen in MAU on 05/20/2019 and 3/5 with complaints of vaginal bleeding. [place] on 3/4 showed IUGS but no yolk sac, and then [place] on 3/5 showed IUGS now lower in the uterine segment.   Today vaginal bleeding has improved and she feels no pain right now. She passed some tissue on Friday and Saturday. Patient has pictures on her phone of what looks like probably POC. She says that she only has to change her pad once per day and bleeding is lightening up significantly.  She denies nausea, vomiting, body aches, fever, SOB.  She felt nauseated on Friday but none today.   Beta today is 822, drop from 3900 on 3/5  Reviewed with patient that her drop in Beta, plus improvement in symptoms indicates that patient is miscarrying. Reviewed bleeding, signs of infection. Informed her that she will have weekly blood draws until her bHCG has dropped and a provider visit in two weeks. Message sent to [facility] to schedule. Patient verbalized understanding.    Charlesetta Garibaldi Shauntel Prest 05/23/2019, 12:03 PM

## 2019-05-31 ENCOUNTER — Other Ambulatory Visit: Payer: Self-pay

## 2019-05-31 DIAGNOSIS — O039 Complete or unspecified spontaneous abortion without complication: Secondary | ICD-10-CM | POA: Diagnosis not present

## 2019-06-01 LAB — BETA HCG QUANT (REF LAB): hCG Quant: 34 m[IU]/mL

## 2019-06-07 ENCOUNTER — Ambulatory Visit: Payer: Self-pay | Admitting: Advanced Practice Midwife

## 2019-06-15 ENCOUNTER — Telehealth: Payer: Self-pay

## 2019-06-15 NOTE — Telephone Encounter (Signed)
Pt called to ask if she needs follow up after SAB, she reports she did not know about her follow up appt that was scheduled for last Monday. I advised pt we will need to reschedule her for that appt. Pt denies any bleeding or pain. Advised pt she will be getting a call from schedulers today to reschedule that appt, pt voices understanding. Message sent to schedulers.

## 2019-06-16 ENCOUNTER — Ambulatory Visit (INDEPENDENT_AMBULATORY_CARE_PROVIDER_SITE_OTHER): Payer: Medicaid Other | Admitting: Medical

## 2019-06-16 ENCOUNTER — Encounter: Payer: Self-pay | Admitting: Medical

## 2019-06-16 ENCOUNTER — Other Ambulatory Visit (HOSPITAL_COMMUNITY)
Admission: RE | Admit: 2019-06-16 | Discharge: 2019-06-16 | Disposition: A | Discharge status: Home / Self Care | Payer: Medicaid Other | Source: Ambulatory Visit | Attending: Medical | Admitting: Medical

## 2019-06-16 ENCOUNTER — Other Ambulatory Visit: Payer: Self-pay

## 2019-06-16 VITALS — BP 100/63 | HR 94 | Wt 132.0 lb

## 2019-06-16 DIAGNOSIS — N898 Other specified noninflammatory disorders of vagina: Secondary | ICD-10-CM | POA: Insufficient documentation

## 2019-06-16 DIAGNOSIS — O039 Complete or unspecified spontaneous abortion without complication: Secondary | ICD-10-CM | POA: Diagnosis not present

## 2019-06-16 LAB — POCT URINALYSIS DIPSTICK
Bilirubin, UA: NEGATIVE
Glucose, UA: NEGATIVE
Ketones, UA: NEGATIVE
Leukocytes, UA: NEGATIVE
Nitrite, UA: NEGATIVE
Protein, UA: NEGATIVE
Spec Grav, UA: 1.025 (ref 1.010–1.025)
Urobilinogen, UA: 0.2 E.U./dL
pH, UA: 6 (ref 5.0–8.0)

## 2019-06-16 NOTE — Progress Notes (Signed)
  History:  [name] is a 24 y.o. G2P1011 who presents to clinic today for follow-up after SAB. The patient was seen in MAU with vaginal bleeding and upon return for repeat hCG had noted a significant drop in hCG level and improvement of bleeding and pain on 05/23/19. The patient returned to [facility] for hCG only on 05/31/19 and at that time hCG had continued to drop to 34. She denies any pain, bleeding, discharge or fever today. She does notice an odor with urination. She would like to try for another pregnancy soon.   The following portions of the patient's history were reviewed and updated as appropriate: allergies, current medications, family history, past medical history, social history, past surgical history and problem list.  Review of Systems:  Review of Systems  Constitutional: Negative for fever.  Gastrointestinal: Negative for abdominal pain.  Genitourinary: Negative for dysuria, frequency and urgency.       Neg - vaginal bleeding, discharge + odor       Objective:  Physical Exam BP 100/63   Pulse 94   Wt 132 lb (59.9 kg)   LMP 03/14/2019   BMI 22.66 kg/m  Physical Exam  Nursing note and vitals reviewed. Constitutional: She is oriented to person, place, and time. She appears well-developed and well-nourished. No distress.  HENT:  Head: Normocephalic and atraumatic.  Cardiovascular: Normal rate.  Respiratory: Effort normal.  GI: Soft. She exhibits no distension and no mass. There is no abdominal tenderness. There is no rebound and no guarding.  Genitourinary: Uterus is not enlarged and not tender. Cervix exhibits no motion tenderness, no discharge and no friability. Right adnexum displays no mass and no tenderness. Left adnexum displays no mass and no tenderness.    Vaginal discharge (small, white, thin) present.     No vaginal bleeding.  No bleeding in the vagina.  Neurological: She is alert and oriented to person, place, and time.  Skin: Skin is warm and dry. No  erythema.  Psychiatric: She has a normal mood and affect.   Results for orders placed or performed in visit on 06/16/19 (from the past 24 hour(s))  POCT Urinalysis Dipstick     Status: None   Collection Time: 06/16/19 11:20 AM  Result Value Ref Range   Color, UA dark yellow    Clarity, UA clear    Glucose, UA Negative Negative   Bilirubin, UA Negative    Ketones, UA Negative    Spec Grav, UA 1.025 1.010 - 1.025   Blood, UA None    pH, UA 6.0 5.0 - 8.0   Protein, UA Negative Negative   Urobilinogen, UA 0.2 0.2 or 1.0 E.U./dL   Nitrite, UA Neg    Leukocytes, UA Negative Negative   Appearance     Odor none      Assessment & Plan:  1. Miscarriage - Beta hCG quant (ref lab) - Patient desires pregnancy soon. Advised to use condoms until first period returns after pregnancy  2. Vaginal odor - UA today WNL - Aptima swab obtained     Patient will be contacted with results and follow-up as indicated   Approximately 17 minutes of face-to-face time was spent with this patient   Kathlene Cote 06/16/2019 11:32 AM

## 2019-06-16 NOTE — Patient Instructions (Signed)
Miscarriage A miscarriage is the loss of an unborn baby (fetus) before the 20th week of pregnancy. Follow these instructions at home: Medicines   Take over-the-counter and prescription medicines only as told by your doctor.  If you were prescribed antibiotic medicine, take it as told by your doctor. Do not stop taking the antibiotic even if you start to feel better.  Do not take NSAIDs unless your doctor says that this is safe for you. NSAIDs include aspirin and ibuprofen. These medicines can cause bleeding. Activity  Rest as directed. Ask your doctor what activities are safe for you.  Have someone help you at home during this time. General instructions  Write down how many pads you use each day and how soaked they are.  Watch the amount of tissue or clumps of blood (blood clots) that you pass from your vagina. Save any large amounts of tissue for your doctor.  Do not use tampons, douche, or have sex until your doctor approves.  To help you and your partner with the process of grieving, talk with your doctor or seek counseling.  When you are ready, meet with your doctor to talk about steps you should take for your health. Also, talk with your doctor about steps to take to have a healthy pregnancy in the future.  Keep all follow-up visits as told by your doctor. This is important. Contact a doctor if:  You have a fever or chills.  You have vaginal discharge that smells bad.  You have more bleeding. Get help right away if:  You have very bad cramps or pain in your back or belly.  You pass clumps of blood that are walnut-sized or larger from your vagina.  You pass tissue that is walnut-sized or larger from your vagina.  You soak more than 1 regular pad in an hour.  You get light-headed or weak.  You faint (pass out).  You have feelings of sadness that do not go away, or you have thoughts of hurting yourself. Summary  A miscarriage is the loss of an unborn baby before  the 20th week of pregnancy.  Follow your doctor's instructions for home care. Keep all follow-up appointments.  To help you and your partner with the process of grieving, talk with your doctor or seek counseling. This information is not intended to replace advice given to you by your health care provider. Make sure you discuss any questions you have with your health care provider. Document Revised: 06/26/2018 Document Reviewed: 04/09/2016 Elsevier Patient Education  2020 Elsevier Inc.  

## 2019-06-16 NOTE — Progress Notes (Signed)
Pt presents for F/U after SAB.   Pt denies any pain or vaginal bleeding today.

## 2019-06-17 LAB — CERVICOVAGINAL ANCILLARY ONLY
Bacterial Vaginitis (gardnerella): NEGATIVE
Candida Glabrata: NEGATIVE
Candida Vaginitis: NEGATIVE
Chlamydia: NEGATIVE
Comment: NEGATIVE
Comment: NEGATIVE
Comment: NEGATIVE
Comment: NEGATIVE
Comment: NEGATIVE
Comment: NORMAL
Neisseria Gonorrhea: NEGATIVE
Trichomonas: NEGATIVE

## 2019-06-17 LAB — BETA HCG QUANT (REF LAB): hCG Quant: 20 m[IU]/mL

## 2019-10-06 ENCOUNTER — Ambulatory Visit (INDEPENDENT_AMBULATORY_CARE_PROVIDER_SITE_OTHER): Payer: Medicaid Other | Admitting: *Deleted

## 2019-10-06 ENCOUNTER — Other Ambulatory Visit (HOSPITAL_COMMUNITY)
Admission: RE | Admit: 2019-10-06 | Discharge: 2019-10-06 | Disposition: A | Discharge status: Home / Self Care | Payer: Medicaid Other | Source: Ambulatory Visit | Attending: Obstetrics and Gynecology | Admitting: Obstetrics and Gynecology

## 2019-10-06 ENCOUNTER — Other Ambulatory Visit: Payer: Self-pay

## 2019-10-06 ENCOUNTER — Encounter: Payer: Self-pay | Admitting: General Practice

## 2019-10-06 VITALS — BP 102/68 | HR 92 | Temp 98.3°F | Wt 140.0 lb

## 2019-10-06 DIAGNOSIS — Z3201 Encounter for pregnancy test, result positive: Secondary | ICD-10-CM

## 2019-10-06 DIAGNOSIS — O99891 Other specified diseases and conditions complicating pregnancy: Secondary | ICD-10-CM

## 2019-10-06 DIAGNOSIS — N898 Other specified noninflammatory disorders of vagina: Secondary | ICD-10-CM | POA: Insufficient documentation

## 2019-10-06 DIAGNOSIS — Z3480 Encounter for supervision of other normal pregnancy, unspecified trimester: Secondary | ICD-10-CM

## 2019-10-06 DIAGNOSIS — R8290 Unspecified abnormal findings in urine: Secondary | ICD-10-CM | POA: Diagnosis not present

## 2019-10-06 DIAGNOSIS — O26899 Other specified pregnancy related conditions, unspecified trimester: Secondary | ICD-10-CM

## 2019-10-06 DIAGNOSIS — Z8619 Personal history of other infectious and parasitic diseases: Secondary | ICD-10-CM

## 2019-10-06 DIAGNOSIS — O09299 Supervision of pregnancy with other poor reproductive or obstetric history, unspecified trimester: Secondary | ICD-10-CM

## 2019-10-06 LAB — POCT URINALYSIS DIPSTICK OB
Bilirubin, UA: NEGATIVE
Blood, UA: NEGATIVE
Glucose, UA: NEGATIVE
Ketones, UA: NEGATIVE
Nitrite, UA: NEGATIVE
POC,PROTEIN,UA: NEGATIVE
Spec Grav, UA: 1.025 (ref 1.010–1.025)
Urobilinogen, UA: 0.2 E.U./dL
pH, UA: 7 (ref 5.0–8.0)

## 2019-10-06 LAB — POCT URINE PREGNANCY: Preg Test, Ur: POSITIVE — AB

## 2019-10-06 NOTE — Progress Notes (Signed)
   PRENATAL INTAKE SUMMARY  [name] presents today New OB Nurse Interview.  OB History    Gravida  3   Para  1   Term  1   Preterm  0   AB  1   Living  1     SAB  1   TAB  0   Ectopic  0   Multiple  0   Live Births  1          I have reviewed the patient's medical, obstetrical, social, and family histories, medications, and available lab results.  SUBJECTIVE She complains of white vaginal discharge and burning sensation after intercourse.  OBJECTIVE Initial nurse interview for history (New OB)  EDD: to be determined once ultrasound is completed. Patient does not know last LMP due to recent miscarriage. GA: to be determined G3P1011 FHT: 157   GENERAL APPEARANCE: alert, well appearing, in no apparent distress, oriented to person, place and time   ASSESSMENT Normal pregnancy Vaginal Discharge  Positive Pregnancy Test Urine dipstick: positive for leukocytes.  PLAN Prenatal care-[facility] Urine Culture/Dip GC/CT (self-vaginal swab): GC, chlamydia, trichomonas, BVAG, CVAG probe sent to lab. Treatment: To be determined once lab results are received ROV prn if symptoms persist or worsen. Ultrasound +14 complete ordered ROI signed to get previous prenatal records  Continue PNV Patient to apply for Trula Slade, Bronson Ing, RN

## 2019-10-07 ENCOUNTER — Telehealth: Payer: Self-pay | Admitting: *Deleted

## 2019-10-07 DIAGNOSIS — B3731 Acute candidiasis of vulva and vagina: Secondary | ICD-10-CM

## 2019-10-07 LAB — CERVICOVAGINAL ANCILLARY ONLY
Bacterial Vaginitis (gardnerella): NEGATIVE
Candida Glabrata: NEGATIVE
Candida Vaginitis: POSITIVE — AB
Chlamydia: NEGATIVE
Comment: NEGATIVE
Comment: NEGATIVE
Comment: NEGATIVE
Comment: NEGATIVE
Comment: NEGATIVE
Comment: NORMAL
Neisseria Gonorrhea: NEGATIVE
Trichomonas: NEGATIVE

## 2019-10-07 MED ORDER — TERCONAZOLE 0.4 % VA CREA: 1.0000 | TOPICAL_CREAM | Freq: Every day | VAGINAL | 0 refills | Status: AC

## 2019-10-07 NOTE — Telephone Encounter (Signed)
-----   Message from Raelyn Mora, [place] sent at 10/07/2019  3:28 PM EDT ----- Please treat yeast

## 2019-10-08 LAB — URINE CULTURE, OB REFLEX

## 2019-10-08 LAB — CULTURE, OB URINE

## 2019-10-14 ENCOUNTER — Ambulatory Visit: Payer: Medicaid Other | Attending: Obstetrics and Gynecology

## 2019-10-14 ENCOUNTER — Other Ambulatory Visit: Payer: Self-pay

## 2019-10-14 ENCOUNTER — Other Ambulatory Visit: Payer: Self-pay | Admitting: *Deleted

## 2019-10-14 DIAGNOSIS — Z363 Encounter for antenatal screening for malformations: Secondary | ICD-10-CM | POA: Diagnosis not present

## 2019-10-14 DIAGNOSIS — O0932 Supervision of pregnancy with insufficient antenatal care, second trimester: Secondary | ICD-10-CM

## 2019-10-14 DIAGNOSIS — Z3A20 20 weeks gestation of pregnancy: Secondary | ICD-10-CM | POA: Diagnosis not present

## 2019-10-14 DIAGNOSIS — Z3480 Encounter for supervision of other normal pregnancy, unspecified trimester: Secondary | ICD-10-CM

## 2019-10-14 DIAGNOSIS — Z3687 Encounter for antenatal screening for uncertain dates: Secondary | ICD-10-CM | POA: Diagnosis not present

## 2019-10-14 DIAGNOSIS — Z362 Encounter for other antenatal screening follow-up: Secondary | ICD-10-CM

## 2019-10-21 ENCOUNTER — Other Ambulatory Visit: Payer: Self-pay

## 2019-10-21 ENCOUNTER — Other Ambulatory Visit (HOSPITAL_COMMUNITY)
Admission: RE | Admit: 2019-10-21 | Discharge: 2019-10-21 | Disposition: A | Discharge status: Home / Self Care | Payer: Medicaid Other | Source: Ambulatory Visit | Attending: Obstetrics and Gynecology | Admitting: Obstetrics and Gynecology

## 2019-10-21 ENCOUNTER — Ambulatory Visit (INDEPENDENT_AMBULATORY_CARE_PROVIDER_SITE_OTHER): Payer: Medicaid Other | Admitting: Obstetrics and Gynecology

## 2019-10-21 ENCOUNTER — Encounter: Payer: Self-pay | Admitting: General Practice

## 2019-10-21 VITALS — BP 95/56 | HR 96 | Temp 98.4°F | Wt 141.6 lb

## 2019-10-21 DIAGNOSIS — Z3480 Encounter for supervision of other normal pregnancy, unspecified trimester: Secondary | ICD-10-CM | POA: Insufficient documentation

## 2019-10-21 DIAGNOSIS — N898 Other specified noninflammatory disorders of vagina: Secondary | ICD-10-CM | POA: Diagnosis not present

## 2019-10-21 DIAGNOSIS — O34219 Maternal care for unspecified type scar from previous cesarean delivery: Secondary | ICD-10-CM

## 2019-10-21 DIAGNOSIS — Z3A21 21 weeks gestation of pregnancy: Secondary | ICD-10-CM | POA: Diagnosis not present

## 2019-10-21 DIAGNOSIS — O26899 Other specified pregnancy related conditions, unspecified trimester: Secondary | ICD-10-CM

## 2019-10-21 DIAGNOSIS — Z8619 Personal history of other infectious and parasitic diseases: Secondary | ICD-10-CM | POA: Diagnosis not present

## 2019-10-21 DIAGNOSIS — N949 Unspecified condition associated with female genital organs and menstrual cycle: Secondary | ICD-10-CM | POA: Diagnosis not present

## 2019-10-21 DIAGNOSIS — Z3482 Encounter for supervision of other normal pregnancy, second trimester: Secondary | ICD-10-CM | POA: Diagnosis not present

## 2019-10-21 DIAGNOSIS — Z124 Encounter for screening for malignant neoplasm of cervix: Secondary | ICD-10-CM | POA: Diagnosis not present

## 2019-10-21 DIAGNOSIS — N9489 Other specified conditions associated with female genital organs and menstrual cycle: Secondary | ICD-10-CM

## 2019-10-21 DIAGNOSIS — Z31430 Encounter of female for testing for genetic disease carrier status for procreative management: Secondary | ICD-10-CM | POA: Diagnosis not present

## 2019-10-22 ENCOUNTER — Encounter: Payer: Self-pay | Admitting: Obstetrics and Gynecology

## 2019-10-22 LAB — CBC/D/PLT+RPR+RH+ABO+RUB AB...
Antibody Screen: NEGATIVE
Basophils Absolute: 0 10*3/uL (ref 0.0–0.2)
Basos: 0 %
EOS (ABSOLUTE): 0 10*3/uL (ref 0.0–0.4)
Eos: 0 %
HCV Ab: <0.1 s/co ratio (ref 0.0–0.9)
HIV Screen 4th Generation wRfx: NONREACTIVE
Hematocrit: 33.3 % — ABNORMAL LOW (ref 34.0–46.6)
Hemoglobin: 11.1 g/dL (ref 11.1–15.9)
Hepatitis B Surface Ag: NEGATIVE
Immature Grans (Abs): 0.1 10*3/uL (ref 0.0–0.1)
Immature Granulocytes: 1 %
Lymphocytes Absolute: 2.3 10*3/uL (ref 0.7–3.1)
Lymphs: 28 %
MCH: 31.4 pg (ref 26.6–33.0)
MCHC: 33.3 g/dL (ref 31.5–35.7)
MCV: 94 fL (ref 79–97)
Monocytes Absolute: 0.5 10*3/uL (ref 0.1–0.9)
Monocytes: 6 %
Neutrophils Absolute: 5.2 10*3/uL (ref 1.4–7.0)
Neutrophils: 65 %
Platelets: 249 10*3/uL (ref 150–450)
RBC: 3.53 x10E6/uL — ABNORMAL LOW (ref 3.77–5.28)
RDW: 13.1 % (ref 11.7–15.4)
RPR Ser Ql: NONREACTIVE
Rh Factor: NEGATIVE
Rubella Antibodies, IGG: <0.9 index — ABNORMAL LOW (ref >0.99)
WBC: 8.1 10*3/uL (ref 3.4–10.8)

## 2019-10-22 LAB — HCV INTERPRETATION

## 2019-10-22 LAB — CERVICOVAGINAL ANCILLARY ONLY
Bacterial Vaginitis (gardnerella): NEGATIVE
Candida Glabrata: NEGATIVE
Candida Vaginitis: POSITIVE — AB
Chlamydia: NEGATIVE
Comment: NEGATIVE
Comment: NEGATIVE
Comment: NEGATIVE
Comment: NEGATIVE
Comment: NEGATIVE
Comment: NORMAL
Neisseria Gonorrhea: NEGATIVE
Trichomonas: NEGATIVE

## 2019-10-22 NOTE — Progress Notes (Signed)
INITIAL OBSTETRICAL VISIT Patient name: Janet Rivers MRN [id number]  Date of birth: Jan 01, 1996 Chief Complaint:   Initial Prenatal Visit  History of Present Illness:   Janet Rivers is a 24 y.o. G108P1011 Hispanic female at [redacted]w[redacted]d by 20.5 wks U/S with an Estimated Date of Delivery: 02/26/20 being seen today for her initial obstetrical visit.  Her obstetrical history is significant for H/O Cesarean Delivery, HSV, H/O SAB, and late to care. This is a planned pregnancy. She and the father of the baby (FOB)/spouse "Jose" live together. She has a support system that consists of spouse/her mother/father/friends. She reports her daughter lives in [place] with her mother. Her daughter has been raised as her mother's child and calls her (the patient) by her first name. Today she reports vaginal irritation. She reports vaginal burning after intercourse.  Patient's last menstrual period was 03/14/2019. Last pap never had. Results were: never had Review of Systems:   Pertinent items are noted in HPI Denies cramping/contractions, leakage of fluid, vaginal bleeding, abnormal vaginal discharge w/ itching/odor/irritation, headaches, visual changes, shortness of breath, chest pain, abdominal pain, severe nausea/vomiting, or problems with urination or bowel movements unless otherwise stated above.  Pertinent History Reviewed:  Reviewed past medical,surgical, social, obstetrical and family history.  Reviewed problem list, medications and allergies. OB History  Gravida Para Term Preterm AB Living  3 1 1  0 1 1  SAB TAB Ectopic Multiple Live Births  1 0 0 0 1    # Outcome Date GA Lbr Len/2nd Weight Sex Delivery Anes PTL Lv  3 Current           2 SAB 05/2019          1 Term 07/29/11 [redacted]w[redacted]d  6 lb 13 oz (3.09 kg) F CS-Unspec  N LIV     Complications: Genital warts complicating pregnancy   Physical Assessment:   Vitals:   10/21/19 1421  BP: (!) 95/56  Pulse: 96  Temp: 98.4 F (36.9 C)  Weight: 141 lb 9.6  oz (64.2 kg)  Body mass index is 24.31 kg/m.       Physical Examination:  General appearance - well appearing, and in no distress  Mental status - alert, oriented to person, place, and time  Psych:  She has a normal mood and affect  Skin - warm and dry, normal color, no suspicious lesions noted  Chest - effort normal, all lung fields clear to auscultation bilaterally  Heart - normal rate and regular rhythm  Abdomen - soft, nontender  Extremities:  No swelling or varicosities noted  Pelvic - VULVA: normal appearing vulva with no masses, tenderness or lesions  VAGINA: normal appearing vagina with normal color and discharge, no lesions.   CERVIX: normal appearing cervix without discharge or lesions, no CMT  Thin prep pap is done with reflex HR HPV   FHTs by doppler: 156 bpm  Assessment & Plan:  1) Low-Risk Pregnancy G3P1011 at [redacted]w[redacted]d with an Estimated Date of Delivery: 02/26/20   2) Initial OB visit - Welcomed to practice and introduced self to patient in addition to discussing other advanced practice providers that she may be seeing at this practice - Congratulated patient - Anticipatory guidance on upcoming appointments - Educated on COVID19 and pregnancy and the integration of virtual appointments  - Educated on babyscripts app- patient reports she has not received email, encouraged to look in spam folder and to call office if she still has not received email - patient verbalizes understanding  3) Supervision of other normal pregnancy, antepartum - Cytology - PAP( [place]) - CBC/D/Plt+RPR+Rh+ABO+Rub Ab... - Cervicovaginal ancillary only( [place]) - Genetic Screening  4) History of genital warts - Desires RCS d/t being afraid of having another outbreak near delivery and ending up with an emergency CS  5) Vaginal discharge during pregnancy, antepartum - Cervicovaginal ancillary only( [place])  6) Vaginal burning - Cervicovaginal ancillary only( [place])   7)  History of cesarean delivery, currently pregnant - Planning RCS; despite 1st CS being at age 51 yo in [place] for HSV outbreak - Schedule to meet with MD for RCS consultation - Will be scheduled for 39 wks  8) [redacted] weeks gestation of pregnancy    Meds: No orders of the defined types were placed in this encounter.   Initial labs obtained Continue prenatal vitamins Reviewed n/v relief measures and warning s/s to report Reviewed recommended weight gain based on pre-gravid BMI Encouraged well-balanced diet Genetic Screening discussed: ordered Cystic fibrosis, SMA, Fragile X screening discussed ordered The nature of [facility] with multiple MDs and other Advanced Practice Providers was explained to patient; also emphasized that residents, students are part of our team.  Discussed optimized OB schedule and video visits. Advised can have an in-office visit whenever she feels she needs to be seen.  Does not have own BP cuff. BP cuff Rx faxed today. Explained to patient that BP will be mailed to her house. Check BP weekly, let us know if >140/90. Advised to call during normal business hours and there is an after-hours nurse line available.    Follow-up: Return in about 7 weeks (around 12/09/2019) for Return OB 2hr GTT .   Orders Placed This Encounter  Procedures  . CBC/D/Plt+RPR+Rh+ABO+Rub Ab...  . Genetic Screening  . Interpretation:    Janet Rivers, CNM 10/21/2019

## 2019-10-23 ENCOUNTER — Other Ambulatory Visit: Payer: Self-pay | Admitting: Obstetrics and Gynecology

## 2019-10-23 DIAGNOSIS — B3731 Acute candidiasis of vulva and vagina: Secondary | ICD-10-CM

## 2019-10-23 MED ORDER — TERCONAZOLE 0.4 % VA CREA: 1.0000 | TOPICAL_CREAM | Freq: Every day | VAGINAL | 0 refills | Status: AC

## 2019-10-25 LAB — CYTOLOGY - PAP: Diagnosis: NEGATIVE

## 2019-10-28 ENCOUNTER — Encounter: Payer: Self-pay | Admitting: General Practice

## 2019-11-03 ENCOUNTER — Encounter: Payer: Self-pay | Admitting: General Practice

## 2019-11-11 ENCOUNTER — Other Ambulatory Visit: Payer: Self-pay

## 2019-11-11 ENCOUNTER — Ambulatory Visit: Payer: Medicaid Other | Attending: Obstetrics and Gynecology

## 2019-11-11 ENCOUNTER — Ambulatory Visit: Payer: Medicaid Other | Admitting: *Deleted

## 2019-11-11 DIAGNOSIS — Z3480 Encounter for supervision of other normal pregnancy, unspecified trimester: Secondary | ICD-10-CM

## 2019-11-11 DIAGNOSIS — Z8619 Personal history of other infectious and parasitic diseases: Secondary | ICD-10-CM | POA: Insufficient documentation

## 2019-11-11 DIAGNOSIS — O0932 Supervision of pregnancy with insufficient antenatal care, second trimester: Secondary | ICD-10-CM | POA: Diagnosis not present

## 2019-11-11 DIAGNOSIS — Z362 Encounter for other antenatal screening follow-up: Secondary | ICD-10-CM

## 2019-11-11 DIAGNOSIS — Z3A24 24 weeks gestation of pregnancy: Secondary | ICD-10-CM | POA: Diagnosis not present

## 2019-11-12 ENCOUNTER — Other Ambulatory Visit: Payer: Self-pay | Admitting: *Deleted

## 2019-11-12 DIAGNOSIS — Z3480 Encounter for supervision of other normal pregnancy, unspecified trimester: Secondary | ICD-10-CM

## 2019-11-12 MED ORDER — PRENATAL 27-0.8 MG PO TABS: 1.0000 | ORAL_TABLET | Freq: Every day | ORAL | 12 refills | Status: DC

## 2019-11-17 ENCOUNTER — Other Ambulatory Visit: Payer: Self-pay | Admitting: *Deleted

## 2019-11-17 DIAGNOSIS — Z3480 Encounter for supervision of other normal pregnancy, unspecified trimester: Secondary | ICD-10-CM

## 2019-11-17 MED ORDER — PRENATAL 27-0.8 MG PO TABS: 1.0000 | ORAL_TABLET | Freq: Every day | ORAL | 12 refills | Status: DC

## 2019-12-06 ENCOUNTER — Other Ambulatory Visit (INDEPENDENT_AMBULATORY_CARE_PROVIDER_SITE_OTHER): Payer: Medicaid Other | Admitting: *Deleted

## 2019-12-06 ENCOUNTER — Other Ambulatory Visit: Payer: Self-pay

## 2019-12-06 DIAGNOSIS — Z3480 Encounter for supervision of other normal pregnancy, unspecified trimester: Secondary | ICD-10-CM

## 2019-12-06 DIAGNOSIS — Z23 Encounter for immunization: Secondary | ICD-10-CM | POA: Diagnosis not present

## 2019-12-06 MED ORDER — PRENATAL 27-1 MG PO TABS: 1.0000 | ORAL_TABLET | Freq: Every day | ORAL | 9 refills | Status: DC

## 2019-12-06 NOTE — Progress Notes (Signed)
   Patient in clinic to complete 2 hr gtt.  Tdap and Flu vaccine given.  Clovis Pu, RN

## 2019-12-07 LAB — GLUCOSE TOLERANCE, 2 HOURS W/ 1HR
Glucose, 1 hour: 108 mg/dL (ref 65–179)
Glucose, 2 hour: 62 mg/dL — ABNORMAL LOW (ref 65–152)
Glucose, Fasting: 79 mg/dL (ref 65–91)

## 2019-12-15 ENCOUNTER — Other Ambulatory Visit: Payer: Self-pay

## 2019-12-15 ENCOUNTER — Ambulatory Visit (INDEPENDENT_AMBULATORY_CARE_PROVIDER_SITE_OTHER): Payer: Medicaid Other | Admitting: Obstetrics & Gynecology

## 2019-12-15 VITALS — BP 94/59 | HR 101 | Temp 98.0°F | Wt 144.6 lb

## 2019-12-15 DIAGNOSIS — Z3A29 29 weeks gestation of pregnancy: Secondary | ICD-10-CM | POA: Diagnosis not present

## 2019-12-15 DIAGNOSIS — O26899 Other specified pregnancy related conditions, unspecified trimester: Secondary | ICD-10-CM

## 2019-12-15 DIAGNOSIS — Z6791 Unspecified blood type, Rh negative: Secondary | ICD-10-CM

## 2019-12-15 DIAGNOSIS — O26893 Other specified pregnancy related conditions, third trimester: Secondary | ICD-10-CM

## 2019-12-15 DIAGNOSIS — Z3483 Encounter for supervision of other normal pregnancy, third trimester: Secondary | ICD-10-CM

## 2019-12-15 DIAGNOSIS — O26891 Other specified pregnancy related conditions, first trimester: Secondary | ICD-10-CM | POA: Insufficient documentation

## 2019-12-15 DIAGNOSIS — Z3480 Encounter for supervision of other normal pregnancy, unspecified trimester: Secondary | ICD-10-CM | POA: Diagnosis not present

## 2019-12-15 DIAGNOSIS — O34219 Maternal care for unspecified type scar from previous cesarean delivery: Secondary | ICD-10-CM | POA: Diagnosis not present

## 2019-12-15 HISTORY — DX: Other specified pregnancy related conditions, unspecified trimester: O26.899

## 2019-12-15 HISTORY — DX: Unspecified blood type, rh negative: Z67.91

## 2019-12-15 MED ORDER — RHO D IMMUNE GLOBULIN 1500 UNIT/2ML IJ SOSY
300.0000 ug | PREFILLED_SYRINGE | Freq: Once | INTRAMUSCULAR | Status: AC
  Administered 2019-12-15: 300 ug via INTRAMUSCULAR

## 2019-12-15 NOTE — Patient Instructions (Signed)
Return to office for any scheduled appointments. Call the office or go to the MAU at [facility] if:  You begin to have strong, frequent contractions  Your water breaks.  Sometimes it is a big gush of fluid, sometimes it is just a trickle that keeps getting your panties wet or running down your legs  You have vaginal bleeding.  It is normal to have a small amount of spotting if your cervix was checked.   You do not feel your baby moving like normal.  If you do not, get something to eat and drink and lay down and focus on feeling your baby move.   If your baby is still not moving like normal, you should call the office or go to MAU.  Any other obstetric concerns.   

## 2019-12-15 NOTE — Progress Notes (Signed)
   PRENATAL VISIT NOTE  Subjective:  Janet Rivers is a 24 y.o. G3P1011 at [redacted]w[redacted]d being seen today for ongoing prenatal care.  She is currently monitored for the following issues for this low-risk pregnancy and has Miscarriage; Supervision of other normal pregnancy, antepartum; History of genital warts; Previous cesarean delivery, antepartum; and Rh negative state in antepartum period on their problem list.  Patient reports no complaints.  Contractions: Not present. Vag. Bleeding: None.  Movement: Present. Denies leaking of fluid.   The following portions of the patient's history were reviewed and updated as appropriate: allergies, current medications, past family history, past medical history, past social history, past surgical history and problem list.   Objective:   Vitals:   12/15/19 1324  BP: (!) 94/59  Pulse: (!) 101  Temp: 98 F (36.7 C)  Weight: 144 lb 9.6 oz (65.6 kg)    Fetal Status: Fetal Heart Rate (bpm): 140 Fundal Height: 28 cm Movement: Present     General:  Alert, oriented and cooperative. Patient is in no acute distress.  Skin: Skin is warm and dry. No rash noted.   Cardiovascular: Normal heart rate noted  Respiratory: Normal respiratory effort, no problems with respiration noted  Abdomen: Soft, gravid, appropriate for gestational age.  Pain/Pressure: Absent     Pelvic: Cervical exam deferred        Extremities: Normal range of motion.  Edema: None  Mental Status: Normal mood and affect. Normal behavior. Normal judgment and thought content.   Assessment and Plan:  Pregnancy: G3P1011 at [redacted]w[redacted]d 1. Previous cesarean delivery, antepartum Counseled regarding TOLAC vs RCS; risks/benefits discussed in detail. All questions answered.  Patient elects for RCS, declines TOLAC despite heavy encouragement, consent signed 12/15/2019. Surgical order sent to schedule RCS at 39 weeks, declines LARC and plans for OCPs.  2. Rh negative state in antepartum period, third trimester -  rho (d) immune globulin (RHIG/RHOPHYLAC) injection 300 mcg given today.  3. [redacted] weeks gestation of pregnancy 4. Supervision of other normal pregnancy, antepartum Normal third trimester labs.  Preterm labor symptoms and general obstetric precautions including but not limited to vaginal bleeding, contractions, leaking of fluid and fetal movement were reviewed in detail with the patient. Please refer to After Visit Summary for other counseling recommendations.   Return in about 2 weeks (around 12/29/2019) for OFFICE OB Visit.  Future Appointments  Date Time Provider Department Center  12/30/2019  1:50 PM Raelyn Mora, CNM [facility] None    Jaynie Collins, MD

## 2019-12-16 ENCOUNTER — Encounter: Payer: Self-pay | Admitting: General Practice

## 2019-12-30 ENCOUNTER — Encounter: Payer: Medicaid Other | Admitting: Obstetrics and Gynecology

## 2019-12-31 DIAGNOSIS — Z20822 Contact with and (suspected) exposure to covid-19: Secondary | ICD-10-CM | POA: Diagnosis not present

## 2019-12-31 DIAGNOSIS — Z03818 Encounter for observation for suspected exposure to other biological agents ruled out: Secondary | ICD-10-CM | POA: Diagnosis not present

## 2020-01-14 ENCOUNTER — Encounter: Payer: Medicaid Other | Admitting: Certified Nurse Midwife

## 2020-01-14 ENCOUNTER — Encounter: Payer: Self-pay | Admitting: General Practice

## 2020-01-29 ENCOUNTER — Other Ambulatory Visit: Payer: Self-pay | Admitting: Obstetrics and Gynecology

## 2020-01-29 ENCOUNTER — Encounter: Payer: Self-pay | Admitting: Obstetrics and Gynecology

## 2020-01-29 DIAGNOSIS — Z87898 Personal history of other specified conditions: Secondary | ICD-10-CM | POA: Insufficient documentation

## 2020-01-31 ENCOUNTER — Telehealth: Payer: Self-pay | Admitting: General Practice

## 2020-01-31 NOTE — Telephone Encounter (Signed)
Called patient to schedule follow up visit, but no answer.  Unable to leave message on VM due to mailbox is full.  Sent Mychart message on 01/14/2020 for pt to call to reschedule, but pt has not viewed message.

## 2020-02-07 ENCOUNTER — Encounter (HOSPITAL_COMMUNITY): Payer: Self-pay

## 2020-02-07 NOTE — Patient Instructions (Signed)
Janet Rivers  02/07/2020   Your procedure is scheduled on:  02/19/2020  Arrive at 0730 at Entrance C on [employer] at [facility]  and [employer]. You are invited to use the FREE valet parking or use the Visitor's parking deck.  Pick up the phone at the desk and dial [phone].  Call this number if you have problems the morning of surgery: [phone]  Remember:   Do not eat food:(After Midnight) Desps de medianoche.  Do not drink clear liquids: (After Midnight) Desps de medianoche.  Take these medicines the morning of surgery with A SIP OF WATER:  none   Do not wear jewelry, make-up or nail polish.  Do not wear lotions, powders, or perfumes. Do not wear deodorant.  Do not shave 48 hours prior to surgery.  Do not bring valuables to the hospital.  [facility] is not   responsible for any belongings or valuables brought to the hospital.  Contacts, dentures or bridgework may not be worn into surgery.  Leave suitcase in the car. After surgery it may be brought to your room.  For patients admitted to the hospital, checkout time is 11:00 AM the day of              discharge.      Please read over the following fact sheets that you were given:     Preparing for Surgery

## 2020-02-08 DIAGNOSIS — Z3480 Encounter for supervision of other normal pregnancy, unspecified trimester: Secondary | ICD-10-CM

## 2020-02-08 MED ORDER — PRENATAL 27-1 MG PO TABS: 1.0000 | ORAL_TABLET | Freq: Every day | ORAL | 9 refills | Status: DC

## 2020-02-16 ENCOUNTER — Encounter: Payer: Self-pay | Admitting: Advanced Practice Midwife

## 2020-02-16 ENCOUNTER — Other Ambulatory Visit (HOSPITAL_COMMUNITY)
Admission: RE | Admit: 2020-02-16 | Discharge: 2020-02-16 | Disposition: A | Discharge status: Home / Self Care | Payer: Medicaid Other | Source: Ambulatory Visit | Attending: Advanced Practice Midwife | Admitting: Advanced Practice Midwife

## 2020-02-16 ENCOUNTER — Encounter: Payer: Self-pay | Admitting: General Practice

## 2020-02-16 ENCOUNTER — Ambulatory Visit (INDEPENDENT_AMBULATORY_CARE_PROVIDER_SITE_OTHER): Payer: Medicaid Other | Admitting: Advanced Practice Midwife

## 2020-02-16 ENCOUNTER — Other Ambulatory Visit: Payer: Self-pay

## 2020-02-16 VITALS — BP 108/69 | HR 98 | Temp 86.0°F

## 2020-02-16 DIAGNOSIS — Z3A38 38 weeks gestation of pregnancy: Secondary | ICD-10-CM | POA: Diagnosis not present

## 2020-02-16 DIAGNOSIS — Z3480 Encounter for supervision of other normal pregnancy, unspecified trimester: Secondary | ICD-10-CM | POA: Diagnosis not present

## 2020-02-16 NOTE — Progress Notes (Signed)
   PRENATAL VISIT NOTE  Subjective:  Janet Rivers is a 24 y.o. G3P1011 at [redacted]w[redacted]d being seen today for ongoing prenatal care.  She is currently monitored for the following issues for this low-risk pregnancy and has Supervision of other normal pregnancy, antepartum; History of genital warts; Previous cesarean delivery, antepartum; Rh negative state in antepartum period; and History of anesthesia reaction on their problem list.  Patient reports no complaints.  Contractions: Not present. Vag. Bleeding: None.  Movement: Present. Denies leaking of fluid.   The following portions of the patient's history were reviewed and updated as appropriate: allergies, current medications, past family history, past medical history, past social history, past surgical history and problem list.   Objective:   Vitals:   02/16/20 1351  BP: 108/69  Pulse: 98  Temp: (!) 86 F (30 C)  Patient had a cold drink just prior to coming in for visit.    Fetal Status: Fetal Heart Rate (bpm): 144 Fundal Height: 36 cm Movement: Present     General:  Alert, oriented and cooperative. Patient is in no acute distress.  Skin: Skin is warm and dry. No rash noted.   Cardiovascular: Normal heart rate noted  Respiratory: Normal respiratory effort, no problems with respiration noted  Abdomen: Soft, gravid, appropriate for gestational age.  Pain/Pressure: Absent     Pelvic: Cervical exam performed in the presence of a chaperone Dilation: Fingertip Effacement (%): Thick Station: -2  Extremities: Normal range of motion.  Edema: None  Mental Status: Normal mood and affect. Normal behavior. Normal judgment and thought content.   Assessment and Plan:  Pregnancy: G3P1011 at [redacted]w[redacted]d 1. Supervision of other normal pregnancy, antepartum - Culture, beta strep (group b only) - Cervicovaginal ancillary only( [place])  2. [redacted] weeks gestation of pregnancy - routine care  - c-section is scheduled for 02/19/2020  Term labor symptoms and  general obstetric precautions including but not limited to vaginal bleeding, contractions, leaking of fluid and fetal movement were reviewed in detail with the patient. Please refer to After Visit Summary for other counseling recommendations.   Return in about 1 week (around 02/23/2020).  Future Appointments  Date Time Provider Department Center  02/17/2020  9:00 AM [facility] PAT 1 [facility] None    Thressa Sheller DNP, CNM  02/16/20  2:11 PM

## 2020-02-17 ENCOUNTER — Encounter (HOSPITAL_COMMUNITY)
Admission: RE | Admit: 2020-02-17 | Discharge: 2020-02-17 | Disposition: A | Discharge status: Home / Self Care | Payer: Medicaid Other | Source: Ambulatory Visit | Attending: Obstetrics and Gynecology | Admitting: Obstetrics and Gynecology

## 2020-02-17 ENCOUNTER — Other Ambulatory Visit (HOSPITAL_COMMUNITY): Payer: Medicaid Other

## 2020-02-17 DIAGNOSIS — Z01812 Encounter for preprocedural laboratory examination: Secondary | ICD-10-CM | POA: Insufficient documentation

## 2020-02-17 HISTORY — DX: Personal history of other complications of pregnancy, childbirth and the puerperium: Z87.59

## 2020-02-17 LAB — CERVICOVAGINAL ANCILLARY ONLY
Bacterial Vaginitis (gardnerella): NEGATIVE
Candida Glabrata: NEGATIVE
Candida Vaginitis: POSITIVE — AB
Chlamydia: NEGATIVE
Comment: NEGATIVE
Comment: NEGATIVE
Comment: NEGATIVE
Comment: NEGATIVE
Comment: NEGATIVE
Comment: NORMAL
Neisseria Gonorrhea: NEGATIVE
Trichomonas: NEGATIVE

## 2020-02-17 LAB — CBC
HCT: 35.8 % — ABNORMAL LOW (ref 36.0–46.0)
Hemoglobin: 11.9 g/dL — ABNORMAL LOW (ref 12.0–15.0)
MCH: 32.6 pg (ref 26.0–34.0)
MCHC: 33.2 g/dL (ref 30.0–36.0)
MCV: 98.1 fL (ref 80.0–100.0)
Platelets: 203 10*3/uL (ref 150–400)
RBC: 3.65 MIL/uL — ABNORMAL LOW (ref 3.87–5.11)
RDW: 14 % (ref 11.5–15.5)
WBC: 5.8 10*3/uL (ref 4.0–10.5)
nRBC: 0 % (ref 0.0–0.2)

## 2020-02-17 LAB — TYPE AND SCREEN
ABO/RH(D): O NEG
Antibody Screen: POSITIVE

## 2020-02-17 LAB — RPR: RPR Ser Ql: NONREACTIVE

## 2020-02-17 NOTE — Pre-Procedure Instructions (Signed)
Strongly requested pt to contact her attorney and or the previous hospital before her CS to learn what her anesthesia medications were with her previous reaction.  Pt has not contacted anyone at this point.

## 2020-02-19 ENCOUNTER — Inpatient Hospital Stay (HOSPITAL_COMMUNITY): Payer: Medicaid Other | Admitting: Anesthesiology

## 2020-02-19 ENCOUNTER — Encounter (HOSPITAL_COMMUNITY)
Admission: RE | Disposition: A | Discharge status: Home / Self Care | Payer: Self-pay | Source: Home / Self Care | Attending: Obstetrics and Gynecology

## 2020-02-19 ENCOUNTER — Other Ambulatory Visit: Payer: Self-pay

## 2020-02-19 ENCOUNTER — Encounter (HOSPITAL_COMMUNITY): Payer: Self-pay | Admitting: Obstetrics and Gynecology

## 2020-02-19 ENCOUNTER — Inpatient Hospital Stay (HOSPITAL_COMMUNITY)
Admission: RE | Admit: 2020-02-19 | Discharge: 2020-02-21 | DRG: 788 | Disposition: A | Discharge status: Home / Self Care | Payer: Medicaid Other | Attending: Obstetrics and Gynecology | Admitting: Obstetrics and Gynecology

## 2020-02-19 DIAGNOSIS — Z3480 Encounter for supervision of other normal pregnancy, unspecified trimester: Secondary | ICD-10-CM

## 2020-02-19 DIAGNOSIS — O34219 Maternal care for unspecified type scar from previous cesarean delivery: Secondary | ICD-10-CM | POA: Diagnosis present

## 2020-02-19 DIAGNOSIS — O34211 Maternal care for low transverse scar from previous cesarean delivery: Principal | ICD-10-CM | POA: Diagnosis present

## 2020-02-19 DIAGNOSIS — O26893 Other specified pregnancy related conditions, third trimester: Secondary | ICD-10-CM | POA: Diagnosis not present

## 2020-02-19 DIAGNOSIS — Z2839 Other underimmunization status: Secondary | ICD-10-CM

## 2020-02-19 DIAGNOSIS — Z98891 History of uterine scar from previous surgery: Secondary | ICD-10-CM | POA: Diagnosis present

## 2020-02-19 DIAGNOSIS — Z3A39 39 weeks gestation of pregnancy: Secondary | ICD-10-CM | POA: Diagnosis not present

## 2020-02-19 DIAGNOSIS — Z23 Encounter for immunization: Secondary | ICD-10-CM

## 2020-02-19 DIAGNOSIS — Z6791 Unspecified blood type, Rh negative: Secondary | ICD-10-CM | POA: Diagnosis not present

## 2020-02-19 DIAGNOSIS — O26891 Other specified pregnancy related conditions, first trimester: Secondary | ICD-10-CM

## 2020-02-19 DIAGNOSIS — O26899 Other specified pregnancy related conditions, unspecified trimester: Secondary | ICD-10-CM

## 2020-02-19 DIAGNOSIS — Z8619 Personal history of other infectious and parasitic diseases: Secondary | ICD-10-CM

## 2020-02-19 DIAGNOSIS — O09899 Supervision of other high risk pregnancies, unspecified trimester: Secondary | ICD-10-CM

## 2020-02-19 DIAGNOSIS — O99891 Other specified diseases and conditions complicating pregnancy: Secondary | ICD-10-CM

## 2020-02-19 DIAGNOSIS — Z87898 Personal history of other specified conditions: Secondary | ICD-10-CM

## 2020-02-19 LAB — CBC
HCT: 30.3 % — ABNORMAL LOW (ref 36.0–46.0)
Hemoglobin: 10.7 g/dL — ABNORMAL LOW (ref 12.0–15.0)
MCH: 33.9 pg (ref 26.0–34.0)
MCHC: 35.3 g/dL (ref 30.0–36.0)
MCV: 95.9 fL (ref 80.0–100.0)
Platelets: 189 10*3/uL (ref 150–400)
RBC: 3.16 MIL/uL — ABNORMAL LOW (ref 3.87–5.11)
RDW: 14 % (ref 11.5–15.5)
WBC: 10.7 10*3/uL — ABNORMAL HIGH (ref 4.0–10.5)
nRBC: 0 % (ref 0.0–0.2)

## 2020-02-19 LAB — CREATININE, SERUM
Creatinine, Ser: 0.41 mg/dL — ABNORMAL LOW (ref 0.44–1.00)
GFR, Estimated: >60 mL/min (ref >60)

## 2020-02-19 SURGERY — Surgical Case
Anesthesia: Spinal | Wound class: Clean Contaminated

## 2020-02-19 MED ORDER — NALBUPHINE HCL 10 MG/ML IJ SOLN: 5.0000 mg | Freq: Once | INTRAMUSCULAR | Status: DC | PRN

## 2020-02-19 MED ORDER — ONDANSETRON HCL 4 MG/2ML IJ SOLN
INTRAMUSCULAR | Status: DC | PRN
  Administered 2020-02-19: 4 mg via INTRAVENOUS

## 2020-02-19 MED ORDER — FENTANYL CITRATE (PF) 100 MCG/2ML IJ SOLN
INTRAMUSCULAR | Status: DC | PRN
  Administered 2020-02-19: 15 ug via INTRATHECAL

## 2020-02-19 MED ORDER — MENTHOL 3 MG MT LOZG: 1.0000 | LOZENGE | OROMUCOSAL | Status: DC | PRN

## 2020-02-19 MED ORDER — NALBUPHINE HCL 10 MG/ML IJ SOLN: 5.0000 mg | INTRAMUSCULAR | Status: DC | PRN

## 2020-02-19 MED ORDER — SIMETHICONE 80 MG PO CHEW
80.0000 mg | CHEWABLE_TABLET | Freq: Three times a day (TID) | ORAL | Status: DC
  Administered 2020-02-20 – 2020-02-21 (×4): 80 mg via ORAL
  Filled 2020-02-19 (×4): qty 1

## 2020-02-19 MED ORDER — CEFAZOLIN SODIUM-DEXTROSE 2-4 GM/100ML-% IV SOLN: 2.0000 g | INTRAVENOUS | Status: DC

## 2020-02-19 MED ORDER — KETOROLAC TROMETHAMINE 30 MG/ML IJ SOLN
30.0000 mg | Freq: Four times a day (QID) | INTRAMUSCULAR | Status: AC | PRN
  Administered 2020-02-19: 30 mg via INTRAVENOUS

## 2020-02-19 MED ORDER — OXYCODONE HCL 5 MG PO TABS: 5.0000 mg | ORAL_TABLET | ORAL | Status: DC | PRN

## 2020-02-19 MED ORDER — CEFAZOLIN SODIUM-DEXTROSE 2-3 GM-%(50ML) IV SOLR
INTRAVENOUS | Status: DC | PRN
  Administered 2020-02-19: 2 g via INTRAVENOUS

## 2020-02-19 MED ORDER — SOD CITRATE-CITRIC ACID 500-334 MG/5ML PO SOLN
ORAL | Status: AC
  Filled 2020-02-19: qty 30

## 2020-02-19 MED ORDER — ACETAMINOPHEN 500 MG PO TABS
1000.0000 mg | ORAL_TABLET | Freq: Three times a day (TID) | ORAL | Status: DC
  Administered 2020-02-19 – 2020-02-21 (×6): 1000 mg via ORAL
  Filled 2020-02-19 (×5): qty 2

## 2020-02-19 MED ORDER — PHENYLEPHRINE HCL-NACL 20-0.9 MG/250ML-% IV SOLN
INTRAVENOUS | Status: DC | PRN
  Administered 2020-02-19: 60 ug/min via INTRAVENOUS

## 2020-02-19 MED ORDER — OXYTOCIN-SODIUM CHLORIDE 30-0.9 UT/500ML-% IV SOLN: 2.5000 [IU]/h | INTRAVENOUS | Status: AC

## 2020-02-19 MED ORDER — DIPHENHYDRAMINE HCL 25 MG PO CAPS: 25.0000 mg | ORAL_CAPSULE | ORAL | Status: DC | PRN

## 2020-02-19 MED ORDER — SENNOSIDES-DOCUSATE SODIUM 8.6-50 MG PO TABS
2.0000 | ORAL_TABLET | ORAL | Status: DC
  Administered 2020-02-20 – 2020-02-21 (×2): 2 via ORAL
  Filled 2020-02-19 (×2): qty 2

## 2020-02-19 MED ORDER — DEXAMETHASONE SODIUM PHOSPHATE 10 MG/ML IJ SOLN
INTRAMUSCULAR | Status: DC | PRN
  Administered 2020-02-19: 10 mg via INTRAVENOUS

## 2020-02-19 MED ORDER — KETOROLAC TROMETHAMINE 30 MG/ML IJ SOLN: 30.0000 mg | Freq: Four times a day (QID) | INTRAMUSCULAR | Status: AC | PRN

## 2020-02-19 MED ORDER — ACETAMINOPHEN 500 MG PO TABS
1000.0000 mg | ORAL_TABLET | Freq: Four times a day (QID) | ORAL | Status: AC
  Filled 2020-02-19: qty 2

## 2020-02-19 MED ORDER — PRENATAL MULTIVITAMIN CH
1.0000 | ORAL_TABLET | Freq: Every day | ORAL | Status: DC
  Administered 2020-02-20 – 2020-02-21 (×2): 1 via ORAL
  Filled 2020-02-19 (×2): qty 1

## 2020-02-19 MED ORDER — DIBUCAINE (PERIANAL) 1 % EX OINT: 1.0000 "application " | TOPICAL_OINTMENT | CUTANEOUS | Status: DC | PRN

## 2020-02-19 MED ORDER — ENOXAPARIN SODIUM 40 MG/0.4ML ~~LOC~~ SOLN
40.0000 mg | SUBCUTANEOUS | Status: DC
  Administered 2020-02-20 – 2020-02-21 (×2): 40 mg via SUBCUTANEOUS
  Filled 2020-02-19 (×2): qty 0.4

## 2020-02-19 MED ORDER — OXYTOCIN-SODIUM CHLORIDE 30-0.9 UT/500ML-% IV SOLN
INTRAVENOUS | Status: DC | PRN
  Administered 2020-02-19: 300 mL via INTRAVENOUS

## 2020-02-19 MED ORDER — LACTATED RINGERS IV SOLN: INTRAVENOUS | Status: DC | PRN

## 2020-02-19 MED ORDER — SOD CITRATE-CITRIC ACID 500-334 MG/5ML PO SOLN
30.0000 mL | ORAL | Status: AC
  Administered 2020-02-19: 30 mL via ORAL

## 2020-02-19 MED ORDER — LACTATED RINGERS IV SOLN: INTRAVENOUS | Status: DC

## 2020-02-19 MED ORDER — KETOROLAC TROMETHAMINE 30 MG/ML IJ SOLN
INTRAMUSCULAR | Status: AC
  Filled 2020-02-19: qty 1

## 2020-02-19 MED ORDER — KETOROLAC TROMETHAMINE 30 MG/ML IJ SOLN
30.0000 mg | Freq: Four times a day (QID) | INTRAMUSCULAR | Status: AC
  Administered 2020-02-20 (×3): 30 mg via INTRAVENOUS
  Filled 2020-02-19 (×3): qty 1

## 2020-02-19 MED ORDER — DIPHENHYDRAMINE HCL 50 MG/ML IJ SOLN: 12.5000 mg | INTRAMUSCULAR | Status: DC | PRN

## 2020-02-19 MED ORDER — COCONUT OIL OIL: 1.0000 "application " | TOPICAL_OIL | Status: DC | PRN

## 2020-02-19 MED ORDER — NALOXONE HCL 0.4 MG/ML IJ SOLN: 0.4000 mg | INTRAMUSCULAR | Status: DC | PRN

## 2020-02-19 MED ORDER — MORPHINE SULFATE (PF) 0.5 MG/ML IJ SOLN
INTRAMUSCULAR | Status: DC | PRN
  Administered 2020-02-19: .15 mg via INTRATHECAL

## 2020-02-19 MED ORDER — DIPHENHYDRAMINE HCL 25 MG PO CAPS: 25.0000 mg | ORAL_CAPSULE | Freq: Four times a day (QID) | ORAL | Status: DC | PRN

## 2020-02-19 MED ORDER — SODIUM CHLORIDE 0.9% FLUSH: 3.0000 mL | INTRAVENOUS | Status: DC | PRN

## 2020-02-19 MED ORDER — INFLUENZA VAC SPLIT QUAD 0.5 ML IM SUSY
0.5000 mL | PREFILLED_SYRINGE | INTRAMUSCULAR | Status: DC
  Filled 2020-02-19: qty 0.5

## 2020-02-19 MED ORDER — SCOPOLAMINE 1 MG/3DAYS TD PT72: 1.0000 | MEDICATED_PATCH | Freq: Once | TRANSDERMAL | Status: DC

## 2020-02-19 MED ORDER — MEPERIDINE HCL 25 MG/ML IJ SOLN: 6.2500 mg | INTRAMUSCULAR | Status: DC | PRN

## 2020-02-19 MED ORDER — BUPIVACAINE IN DEXTROSE 0.75-8.25 % IT SOLN
INTRATHECAL | Status: DC | PRN
  Administered 2020-02-19: 1.6 mg via INTRATHECAL

## 2020-02-19 MED ORDER — ONDANSETRON HCL 4 MG/2ML IJ SOLN: 4.0000 mg | Freq: Three times a day (TID) | INTRAMUSCULAR | Status: DC | PRN

## 2020-02-19 MED ORDER — ALBUMIN HUMAN 5 % IV SOLN: INTRAVENOUS | Status: DC | PRN

## 2020-02-19 MED ORDER — FENTANYL CITRATE (PF) 100 MCG/2ML IJ SOLN: 25.0000 ug | INTRAMUSCULAR | Status: DC | PRN

## 2020-02-19 MED ORDER — WITCH HAZEL-GLYCERIN EX PADS: 1.0000 "application " | MEDICATED_PAD | CUTANEOUS | Status: DC | PRN

## 2020-02-19 MED ORDER — SIMETHICONE 80 MG PO CHEW: 80.0000 mg | CHEWABLE_TABLET | ORAL | Status: DC | PRN

## 2020-02-19 MED ORDER — MORPHINE SULFATE (PF) 0.5 MG/ML IJ SOLN
INTRAMUSCULAR | Status: AC
  Filled 2020-02-19: qty 10

## 2020-02-19 MED ORDER — CEFAZOLIN SODIUM-DEXTROSE 2-4 GM/100ML-% IV SOLN
INTRAVENOUS | Status: AC
  Filled 2020-02-19: qty 100

## 2020-02-19 MED ORDER — ONDANSETRON HCL 4 MG/2ML IJ SOLN
INTRAMUSCULAR | Status: AC
  Filled 2020-02-19: qty 2

## 2020-02-19 MED ORDER — OXYTOCIN-SODIUM CHLORIDE 30-0.9 UT/500ML-% IV SOLN
INTRAVENOUS | Status: AC
  Filled 2020-02-19: qty 500

## 2020-02-19 MED ORDER — NALBUPHINE HCL 10 MG/ML IJ SOLN
5.0000 mg | INTRAMUSCULAR | Status: DC | PRN
  Administered 2020-02-19: 5 mg via INTRAVENOUS
  Filled 2020-02-19: qty 1

## 2020-02-19 MED ORDER — SIMETHICONE 80 MG PO CHEW
80.0000 mg | CHEWABLE_TABLET | ORAL | Status: DC
  Administered 2020-02-20 – 2020-02-21 (×2): 80 mg via ORAL
  Filled 2020-02-19 (×2): qty 1

## 2020-02-19 MED ORDER — IBUPROFEN 800 MG PO TABS
800.0000 mg | ORAL_TABLET | Freq: Four times a day (QID) | ORAL | Status: DC
  Administered 2020-02-20 – 2020-02-21 (×4): 800 mg via ORAL
  Filled 2020-02-19 (×4): qty 1

## 2020-02-19 MED ORDER — FENTANYL CITRATE (PF) 100 MCG/2ML IJ SOLN
INTRAMUSCULAR | Status: AC
  Filled 2020-02-19: qty 2

## 2020-02-19 MED ORDER — MEASLES, MUMPS & RUBELLA VAC IJ SOLR
0.5000 mL | Freq: Once | INTRAMUSCULAR | Status: AC
  Administered 2020-02-21: 0.5 mL via SUBCUTANEOUS
  Filled 2020-02-19: qty 0.5

## 2020-02-19 MED ORDER — PHENYLEPHRINE HCL-NACL 20-0.9 MG/250ML-% IV SOLN
INTRAVENOUS | Status: AC
  Filled 2020-02-19: qty 250

## 2020-02-19 MED ORDER — NALOXONE HCL 4 MG/10ML IJ SOLN
1.0000 ug/kg/h | INTRAVENOUS | Status: DC | PRN
  Filled 2020-02-19: qty 5

## 2020-02-19 SURGICAL SUPPLY — 34 items
BENZOIN TINCTURE PRP APPL 2/3 (GAUZE/BANDAGES/DRESSINGS) ×2 IMPLANT
CANISTER SUCT 3000ML PPV (MISCELLANEOUS) ×2 IMPLANT
CHLORAPREP W/TINT 26ML (MISCELLANEOUS) ×2 IMPLANT
CLOSURE STERI-STRIP 1/4X4 (GAUZE/BANDAGES/DRESSINGS) ×2 IMPLANT
DRSG OPSITE POSTOP 4X10 (GAUZE/BANDAGES/DRESSINGS) ×2 IMPLANT
ELECT REM PT RETURN 9FT ADLT (ELECTROSURGICAL) ×2
ELECTRODE REM PT RTRN 9FT ADLT (ELECTROSURGICAL) ×1 IMPLANT
EXTRACTOR VACUUM KIWI (MISCELLANEOUS) ×2 IMPLANT
GLOVE BIOGEL PI IND STRL 7.0 (GLOVE) ×2 IMPLANT
GLOVE BIOGEL PI IND STRL 7.5 (GLOVE) ×1 IMPLANT
GLOVE BIOGEL PI INDICATOR 7.0 (GLOVE) ×2
GLOVE BIOGEL PI INDICATOR 7.5 (GLOVE) ×1
GLOVE SKINSENSE NS SZ7.0 (GLOVE) ×1
GLOVE SKINSENSE STRL SZ7.0 (GLOVE) ×1 IMPLANT
GOWN STRL REUS W/ TWL LRG LVL3 (GOWN DISPOSABLE) ×2 IMPLANT
GOWN STRL REUS W/ TWL XL LVL3 (GOWN DISPOSABLE) ×1 IMPLANT
GOWN STRL REUS W/TWL LRG LVL3 (GOWN DISPOSABLE) ×2
GOWN STRL REUS W/TWL XL LVL3 (GOWN DISPOSABLE) ×1
NS IRRIG 1000ML POUR BTL (IV SOLUTION) ×2 IMPLANT
PACK C SECTION WH (CUSTOM PROCEDURE TRAY) ×2 IMPLANT
PAD ABD 7.5X8 STRL (GAUZE/BANDAGES/DRESSINGS) ×2 IMPLANT
PAD OB MATERNITY 4.3X12.25 (PERSONAL CARE ITEMS) ×2 IMPLANT
PAD PREP 24X48 CUFFED NSTRL (MISCELLANEOUS) ×2 IMPLANT
PENCIL SMOKE EVAC W/HOLSTER (ELECTROSURGICAL) ×2 IMPLANT
STRIP CLOSURE SKIN 1/2X4 (GAUZE/BANDAGES/DRESSINGS) ×2 IMPLANT
SUT MNCRL 0 VIOLET CTX 36 (SUTURE) ×2 IMPLANT
SUT MON AB 4-0 PS1 27 (SUTURE) ×2 IMPLANT
SUT MONOCRYL 0 CTX 36 (SUTURE) ×2
SUT PLAIN 2 0 XLH (SUTURE) ×2 IMPLANT
SUT VIC AB 0 CT1 36 (SUTURE) ×4 IMPLANT
SUT VIC AB 3-0 CT1 27 (SUTURE) ×1
SUT VIC AB 3-0 CT1 TAPERPNT 27 (SUTURE) ×1 IMPLANT
TOWEL OR 17X24 6PK STRL BLUE (TOWEL DISPOSABLE) ×4 IMPLANT
WATER STERILE IRR 1000ML POUR (IV SOLUTION) ×2 IMPLANT

## 2020-02-19 NOTE — Op Note (Addendum)
Operative Note   SURGERY DATE: 02/19/2020  PRE-OP DIAGNOSIS:  *Pregnancy at [redacted]w[redacted]d * elective repeat c section   POST-OP DIAGNOSIS: same    PROCEDURE: repeat low transverse cesarean section via pfannenstiel skin incision with double layer uterine closure  SURGEON: Surgeon(s) and Role:    [place] Bing, MD - Primary    * Marsala, Arlana Pouch, MD - Assisting  ANESTHESIA: spinal  ESTIMATED BLOOD LOSS:  336 mL  DRAINS: UOP via indwelling foley  TOTAL IV FLUIDS: crystalloid  VTE PROPHYLAXIS: SCDs to bilateral lower extremities  ANTIBIOTICS: Two grams of Cefazolin were given., within 1 hour of skin incision  SPECIMENS: placenta to L&D   COMPLICATIONS: none  INDICATIONS: repeat LTCS, history of one prior LTCS   FINDINGS: No intra-abdominal adhesions were noted. Grossly normal uterus, tubes and ovaries. clear amniotic fluid, cephalic female infant, weight 2655gm, APGARs 8/9, intact placenta.  PROCEDURE IN DETAIL: The patient was taken to the operating room where anesthesia was administered and normal fetal heart tones were confirmed. She was then prepped and draped in the normal fashion in the dorsal supine position with a leftward tilt.  After a time out was performed, a pfannensteil skin incision was made with the scalpel and carried through to the underlying layer of fascia. The fascia was then incised at the midline and this incision was extended laterally with the mayo scissors. Attention was turned to the superior aspect of the fascial incision which was grasped with the kocher clamps x 2, tented up and the rectus muscles were dissected off with the scalpel. The rectus muscles were then separated in the midline and the peritoneum was entered bluntly. The Alexis retractor was inserted. The bladder blade was inserted and the vesicouterine peritoneum was identified, tented up and entered with the metzenbaum scissors. This incision was extended laterally and the bladder flap  was created digitally. The bladder blade was reinserted.  A low transverse hysterotomy was made with the scalpel until the endometrial cavity was breached and the amniotic sac ruptured with the Allis clamp, yielding clear amniotic fluid. This incision was extended bluntly and the infant's head, shoulders and body were delivered atraumatically.The cord was clamped x 2 and cut, and the infant was handed to the awaiting pediatricians, after delayed cord clamping was done.  The placenta was then gradually expressed from the uterus and then the uterus was exteriorized and cleared of all clots and debris. The hysterotomy was repaired with a running suture of 1-0 monocryl. A second imbricating layer of 1-0 monocryl suture was then placed. Excellent hemostasis was noted.   The uterus and adnexa were then returned to the abdomen, and the hysterotomy and all operative sites were reinspected and excellent hemostasis was noted after irrigation and suction of the abdomen with warm saline. Alexis retractor was removed.   The peritoneum was closed with a running stitch of 3-0 Vicryl. The fascia was reapproximated with 0 Vicryl in a simple running fashion bilaterally. The subcutaneous layer was then reapproximated with interrupted sutures of 2-0 plain gut, and the skin was then closed with 4-0 vicryl on a Keith's needle.  The patient  tolerated the procedure well. Sponge, lap, needle, and instrument counts were correct x 2. The patient was transferred to the recovery room awake, alert and breathing independently in stable condition.   Casper Harrison, MD [facility] Fellow, [facility] for [facility], [facility]  Agree with above. I was present and scrubbed for the  entire procedure.   Cornelia Copa MD Attending Center for [employer] Midwife)

## 2020-02-19 NOTE — Discharge Summary (Signed)
Postpartum Discharge Summary     Patient Name: Janet Rivers DOB: [birth year] MRN: [id number]  Date of admission: 02/19/2020 Delivery date:02/19/2020  Delivering provider: Aletha Halim  Date of discharge: 02/21/2020  Admitting diagnosis: History of cesarean delivery, currently pregnant [O34.219] Cesarean delivery delivered [O82] Intrauterine pregnancy: [redacted]w[redacted]d    Secondary diagnosis:  Active Problems:   Previous cesarean delivery, antepartum   Rh negative state in antepartum period   History of anesthesia reaction   History of cesarean delivery, currently pregnant   Cesarean delivery delivered   Rubella non-immune status, antepartum  Additional problems: none    Discharge diagnosis: Term Pregnancy Delivered                                              Post partum procedures:(no Rhogam needed; baby O neg) Augmentation: N/A Complications: None  Hospital course: Scheduled C/S   24y.o. yo [id number] admitted to the hospital 02/19/2020 for scheduled cesarean section with the following indication:Elective Repeat.Delivery details are as follows:  Membrane Rupture Time/Date: 4:46 PM ,02/19/2020   Delivery Method:C-Section, Low Transverse  Details of operation can be found in separate operative note.  Patient had an uncomplicated postpartum course.  She is ambulating, tolerating a regular diet, passing flatus, and urinating well. Patient is discharged home in stable condition on  02/21/20        Newborn Data: Birth date:02/19/2020  Birth time:4:46 PM  Gender:Female  Living status:Living  Apgars:9 ,9  Weight:2655 g  (5lb 13.7oz)  Magnesium Sulfate received: No BMZ received: No Rhophylac:No (baby O neg)  MMR: ordered postpartum  T-DaP:Given prenatally Flu: No Transfusion:No  Physical exam  Vitals:   02/20/20 0756 02/20/20 1512 02/20/20 2035 02/21/20 0700  BP: (!) 96/52 98/63 113/63 (!) 101/59  Pulse: 77 85 86 78  Resp: 16 16  18   Temp: 97.6 F (36.4 C) 98.2 F  (36.8 C) 99 F (37.2 C) 98.6 F (37 C)  TempSrc: Oral Oral Oral Oral  SpO2: 98% 98% 97% 98%  Weight:      Height:       General: alert and cooperative Lochia: appropriate Uterine Fundus: firm Incision: honey intact with some unchanged marked staining DVT Evaluation: No evidence of DVT seen on physical exam. Labs: Lab Results  Component Value Date   WBC 12.0 (H) 02/20/2020   HGB 9.1 (L) 02/20/2020   HCT 26.0 (L) 02/20/2020   MCV 97.0 02/20/2020   PLT 159 02/20/2020   CMP Latest Ref Rng & Units 02/20/2020  Glucose 70 - 99 mg/dL 74  BUN 6 - 20 mg/dL 8  Creatinine 0.44 - 1.00 mg/dL 0.73  Sodium 135 - 145 mmol/L 134(L)  Potassium 3.5 - 5.1 mmol/L 3.3(L)  Chloride 98 - 111 mmol/L 103  CO2 22 - 32 mmol/L 22  Calcium 8.9 - 10.3 mg/dL 8.9  Total Protein 6.5 - 8.1 g/dL 5.8(L)  Total Bilirubin 0.3 - 1.2 mg/dL 1.2  Alkaline Phos 38 - 126 U/L 107  AST 15 - 41 U/L 26  ALT 0 - 44 U/L 16   Edinburgh Score: Edinburgh Postnatal Depression Scale Screening Tool 02/20/2020  I have been able to laugh and see the funny side of things. (No Data)     After visit meds:  Allergies as of 02/21/2020   No Known Allergies     Medication  List    TAKE these medications   ibuprofen 800 MG tablet Commonly known as: ADVIL Take 1 tablet (800 mg total) by mouth every 8 (eight) hours as needed.   oxyCODONE 5 MG immediate release tablet Commonly known as: Oxy IR/ROXICODONE Take 1-2 tablets (5-10 mg total) by mouth every 4 (four) hours as needed for moderate pain.   Prenatal 27-1 MG Tabs Take 1 tablet by mouth daily.        Discharge home in stable condition Infant Feeding: Breast Infant Disposition:home with mother Discharge instruction: per After Visit Summary and Postpartum booklet. Activity: Advance as tolerated. Pelvic rest for 6 weeks.  Diet: routine diet Future Appointments:No future appointments. Follow up Visit:   Please schedule this patient for a In person postpartum visit  in 6 weeks with the following provider: Any provider. Additional Postpartum F/U:Incision check 1 week  Low risk pregnancy complicated by: repeat LTCS Delivery mode:  C-Section, Low Transverse  Anticipated Birth Control:  Nexplanon (outpt)   02/21/2020 Myrtis Ser, CNM  7:07 AM

## 2020-02-19 NOTE — H&P (Signed)
Obstetric Preoperative History and Physical  Janet Rivers is a 24 y.o. G3P1011 with IUP at 27w0dpresenting for scheduled cesarean section.  Reports good fetal movement, no bleeding, no contractions, no leaking of fluid.  No acute preoperative concerns.    Cesarean Section Indication: patient declines vag del attempt, rediscussed on admission.   Prenatal Course Source of Care: [facility]   Pregnancy complications or risks: Patient Active Problem List   Diagnosis Date Noted  . History of cesarean delivery, currently pregnant 02/19/2020  . History of anesthesia reaction 01/29/2020  . Previous cesarean delivery, antepartum 12/15/2019  . Rh negative state in antepartum period 12/15/2019  . Supervision of other normal pregnancy, antepartum 10/06/2019  . History of genital warts 10/06/2019   She plans to breast/bottle feed  She is undecided on birth control.   Prenatal labs and studies: ABO, Rh: --/--/O NEG (12/02 1059) Antibody: POS (12/02 1059) Rubella: <0.90 (08/05 1517) RPR: NON REACTIVE (12/02 1054)  HBsAg: Negative (08/05 1517)  HIV: Non Reactive (08/05 1517)  GACZ:YSAYTKZ/- (12/01 1418) 2 hr Glucola  normal Genetic screening normal Anatomy UKoreanormal  Prenatal Transfer Tool  Maternal Diabetes: No Genetic Screening: Normal Maternal Ultrasounds/Referrals: Normal Fetal Ultrasounds or other Referrals:  None Maternal Substance Abuse:  No Significant Maternal Medications:  None Significant Maternal Lab Results: Rh negative  Past Medical History:  Diagnosis Date  . Complication of anesthesia    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  . History of postpartum hemorrhage     Past Surgical History:  Procedure Laterality Date  . CESAREAN SECTION    . COSMETIC SURGERY  08/2018   was going to have fat taken from belly to buttocks, but heart stopped x4 min with [place]    OB History  Gravida Para Term  Preterm AB Living  3 1 1  0 1 1  SAB TAB Ectopic Multiple Live Births  1 0 0 0 1    # Outcome Date GA Lbr Len/2nd Weight Sex Delivery Anes PTL Lv  3 Current           2 SAB 05/2019          1 Term 07/29/11 [id number] g F CS-Unspec  N LIV     Complications: Genital warts complicating pregnancy    Social History   Socioeconomic History  . Marital status: Married    Spouse name: Jose  . Number of children: 1  . Years of education: Not on file  . Highest education level: High school graduate  Occupational History  . Not on file  Tobacco Use  . Smoking status: Never Smoker  . Smokeless tobacco: Never Used  Vaping Use  . Vaping Use: Never used  Substance and Sexual Activity  . Alcohol use: Not Currently    Comment: occ.   . Drug use: No  . Sexual activity: Yes    Birth control/protection: None  Other Topics Concern  . Not on file  Social History Narrative  . Not on file   Social Determinants of Health   Financial Resource Strain:   . Difficulty of Paying Living Expenses: Not on file  Food Insecurity:   . Worried About RuCharity fundraisern the Last Year: Not on file  . Ran Out of Food in the Last Year: Not on file  Transportation Needs:   . Lack of Transportation (Medical): Not on file  . Lack of Transportation (Non-Medical): Not on  file  Physical Activity:   . Days of Exercise per Week: Not on file  . Minutes of Exercise per Session: Not on file  Stress:   . Feeling of Stress : Not on file  Social Connections:   . Frequency of Communication with Friends and Family: Not on file  . Frequency of Social Gatherings with Friends and Family: Not on file  . Attends Religious Services: Not on file  . Active Member of Clubs or Organizations: Not on file  . Attends Archivist Meetings: Not on file  . Marital Status: Not on file    Family History  Problem Relation Age of Onset  . Healthy Father   . Healthy Mother     Medications Prior to Admission   Medication Sig Dispense Refill Last Dose  . Prenatal 27-1 MG TABS Take 1 tablet by mouth daily. 30 tablet 9     No Known Allergies  Review of Systems: Pertinent items noted in HPI and remainder of comprehensive ROS otherwise negative.  Physical Exam: BP 117/72   Pulse 97   Temp 97.8 F (36.6 C) (Axillary)   Resp 16   Ht 5' 4"  (1.626 m)   Wt 65.8 kg   LMP 03/14/2019   SpO2 100%   BMI 24.89 kg/m  FHR by Doppler: 135 bpm CONSTITUTIONAL: Well-developed, well-nourished female in no acute distress.  HENT:  Normocephalic, atraumatic EYES: Conjunctivae and EOM are normal. No scleral icterus.  NECK: Normal range of motion, supple, no masses SKIN: Skin is warm and dry.   PSYCHIATRIC: Normal mood and affect.  RESPIRATORY: Effort normal  ABDOMEN: Soft, nontender, nondistended, gravid. Well-healed Pfannenstiel incision. PELVIC: Deferred MUSCULOSKELETAL: No edema and no tenderness. 2+ distal pulses.   Pertinent Labs/Studies:   Results for orders placed or performed during the hospital encounter of 02/17/20 (from the past 72 hour(s))  CBC     Status: Abnormal   Collection Time: 02/17/20 10:54 AM  Result Value Ref Range   WBC 5.8 4.0 - 10.5 K/uL   RBC 3.65 (L) 3.87 - 5.11 MIL/uL   Hemoglobin 11.9 (L) 12.0 - 15.0 g/dL   HCT 35.8 (L) 36 - 46 %   MCV 98.1 80.0 - 100.0 fL   MCH 32.6 26.0 - 34.0 pg   MCHC 33.2 30.0 - 36.0 g/dL   RDW 14.0 11.5 - 15.5 %   Platelets 203 150 - 400 K/uL   nRBC 0.0 0.0 - 0.2 %    Comment: Performed at [facility], [place] [address] [place], [place] [address]  RPR     Status: None   Collection Time: 02/17/20 10:54 AM  Result Value Ref Range   RPR Ser Ql NON REACTIVE NON REACTIVE    Comment: Performed at [facility], [place] [address] [place], [place] [address]  Type and screen     Status: None   Collection Time: 02/17/20 10:59 AM  Result Value Ref Range   ABO/RH(D) O NEG    Antibody Screen POS    Sample Expiration 02/20/2020,2359     Antibody Identification      PASSIVELY ACQUIRED ANTI-D Performed at [facility], [place] [address], [place], [place] [address]     Assessment and Plan: Janet Rivers is a 24 y.o. G3P1011 at 40w0dbeing admitted for scheduled cesarean section. The risks of cesarean section discussed with the patient included but were not limited to: bleeding which may require transfusion or reoperation; infection which may require antibiotics;  injury to bowel, bladder, ureters or other surrounding organs; injury to the fetus; need for additional procedures including hysterectomy in the event of a life-threatening hemorrhage; placental abnormalities with subsequent pregnancies, incisional problems, thromboembolic phenomenon and other postoperative/anesthesia complications. The patient concurred with the proposed plan, giving informed written consent for the procedure. Patient has been NPO since last night she will remain NPO for procedure. Anesthesia and OR aware. Preoperative prophylactic antibiotics and SCDs ordered on call to the OR. To OR when ready.   Pregnancy Complications:  -RH Neg, rhogam eval PP -Rubella NI, MMR pp  -Patient has history of some type of adverse event while attempting to undergo anesthesia for a plastic surgery. Possibly had ?cardiac arrest?. Records are in spanish but discussed with patient at bedside. Anesthesia aware.   Contraception: undecided. Discussed at bedside.  Circumcision: yes  Sharene Skeans, MD [facility] Fellow, [facility] for [facility], [place]

## 2020-02-19 NOTE — Anesthesia Procedure Notes (Signed)
Spinal  Patient location during procedure: OR Start time: 02/19/2020 4:20 PM End time: 02/19/2020 4:24 PM Staffing Performed: anesthesiologist  Anesthesiologist: Marcene Duos, MD Preanesthetic Checklist Completed: patient identified, IV checked, site marked, risks and benefits discussed, surgical consent, monitors and equipment checked, pre-op evaluation and timeout performed Spinal Block Patient position: sitting Prep: DuraPrep Patient monitoring: heart rate, cardiac monitor, continuous pulse ox and blood pressure Approach: midline Location: L4-5 Injection technique: single-shot Needle Needle type: Pencan  Needle gauge: 24 G Needle length: 9 cm Assessment Sensory level: T4

## 2020-02-19 NOTE — Transfer of Care (Addendum)
Immediate Anesthesia Transfer of Care Note  Patient: Janet Rivers  Procedure(s) Performed: CESAREAN SECTION (N/A )  Patient Location: PACU  Anesthesia Type:Spinal  Level of Consciousness: awake  Airway & Oxygen Therapy: Patient Spontanous Breathing  Post-op Assessment: Report given to RN and Post -op Vital signs reviewed and stable  Post vital signs: Reviewed and stable  Last Vitals:  Vitals Value Taken Time  BP 93/50 02/19/20 1830  Temp 36.8 C 02/19/20 1742  Pulse 93 02/19/20 1834  Resp 24 02/19/20 1834  SpO2 100 % 02/19/20 1834  Vitals shown include unvalidated device data.  Last Pain:  Vitals:   02/19/20 1822  TempSrc:   PainSc: 3          Complications: No complications documented.

## 2020-02-19 NOTE — Anesthesia Postprocedure Evaluation (Signed)
Anesthesia Post Note  Patient: Janet Rivers  Procedure(s) Performed: CESAREAN SECTION (N/A )     Patient location during evaluation: PACU Anesthesia Type: Spinal Level of consciousness: awake and alert and oriented Pain management: pain level controlled Vital Signs Assessment: post-procedure vital signs reviewed and stable Respiratory status: spontaneous breathing, nonlabored ventilation and respiratory function stable Cardiovascular status: blood pressure returned to baseline and stable Postop Assessment: no headache, no backache, spinal receding and no apparent nausea or vomiting Anesthetic complications: no   No complications documented.  Last Vitals:  Vitals:   02/19/20 1815 02/19/20 1816  BP:    Pulse: 82 86  Resp: (!) 23 15  Temp:    SpO2: 100% 100%    Last Pain:  Vitals:   02/19/20 1822  TempSrc:   PainSc: 3    Pain Goal:    LLE Motor Response: No movement due to regional block (02/19/20 1802) LLE Sensation: Decreased (02/19/20 1802) RLE Motor Response: Purposeful movement (02/19/20 1802) RLE Sensation: Decreased (02/19/20 1802)     Epidural/Spinal Function Cutaneous sensation: Able to Wiggle Toes (02/19/20 1802), Patient able to flex knees: No (02/19/20 1802), Patient able to lift hips off bed: No (02/19/20 1802), Back pain beyond tenderness at insertion site: No (02/19/20 1802), Progressively worsening motor and/or sensory loss: No (02/19/20 1802), Bowel and/or bladder incontinence post epidural: No (02/19/20 1802)  Tennis Must [place]

## 2020-02-19 NOTE — Anesthesia Preprocedure Evaluation (Addendum)
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  CVA    GI/Hepatic negative GI ROS, Neg liver ROS,   Endo/Other  negative endocrine ROS  Renal/GU negative Renal ROS     Musculoskeletal   Abdominal   Peds  Hematology negative hematology ROS (+)   Anesthesia Other Findings   Reproductive/Obstetrics (+) Pregnancy                            Lab Results  Component Value Date   WBC 5.8 02/17/2020   HGB 11.9 (L) 02/17/2020   HCT 35.8 (L) 02/17/2020   MCV 98.1 02/17/2020   PLT 203 02/17/2020    Anesthesia Physical Anesthesia Plan  ASA: III  Anesthesia Plan: Spinal   Post-op Pain Management:    Induction:   PONV Risk Score and Plan: 2 and Dexamethasone, Ondansetron and Treatment may vary due to age or medical condition  Airway Management Planned: Natural Airway  Additional Equipment:   Intra-op Plan:   Post-operative Plan:   Informed Consent: I have reviewed the patients History and Physical, chart, labs and discussed the procedure including the risks, benefits and alternatives for the proposed anesthesia with the patient or authorized representative who has indicated his/her understanding and acceptance.       Plan Discussed with: CRNA  Anesthesia Plan Comments:         Anesthesia Quick Evaluation

## 2020-02-19 NOTE — Lactation Note (Signed)
This note was copied from a baby's chart. Lactation Consultation Note  Patient Name: Janet Rivers KAJGO'T Date: 02/19/2020 Reason for consult: Initial assessment  Initial visit with 6 hours old infant of a P2 mother with some breastfeeding experience. Mother states breastfeeding is going well but she is uncertain of milk supply. Mother explains her feeding goal is to breast and bottle feed infant. Mother expresses concern due to inverted nipples. Provided manual pump for nipple eversion and demonstrated use. Encouraged to use it as needed prior to latching infant.   Reviewed with mother average size of a NB stomach and formula supplementation guidelines. Discussed pace bottle feeding benefits and demonstrated technique. Encourage to follow babies' hunger and fullness cues. Reviewed importance to offer the breast 8 to 12 times in a 24-hour period for proper stimulation and to establish good milk supply. Reviewed signs of good milk transfer. Discussed milk coming to volume. Promoted maternal rest, hydration and food intake. Reviewed newborn behavior and expectations with mother and encouraged to contact [facility] for support, questions or concerns.    All questions answered at this time.   Consult was done in [place].   Maternal Data Formula Feeding for Exclusion: Yes Reason for exclusion: Mother's choice to formula and breast feed on admission Has patient been taught Hand Expression?: Yes Does the patient have breastfeeding experience prior to this delivery?: Yes  Feeding Feeding Type: Breast Fed Nipple Type: Slow - flow  LATCH Score Latch: Repeated attempts needed to sustain latch, nipple held in mouth throughout feeding, stimulation needed to elicit sucking reflex.  Audible Swallowing: A few with stimulation  Type of Nipple: Inverted  Comfort (Breast/Nipple): Soft / non-tender  Hold (Positioning): Full assist, staff holds infant at breast  LATCH Score: 4  Interventions  Interventions: Breast feeding basics reviewed;Skin to skin;Breast massage;Hand express;Pre-pump if needed;Hand pump;Expressed milk  Lactation Tools Discussed/Used WIC Program: Yes Pump Review: Milk Storage;Setup, frequency, and cleaning Initiated by:: Linels Higuera Ancidey IBCLC Date initiated:: 02/20/20   Consult Status Consult Status: Follow-up Date: 02/20/20 Follow-up type: In-patient    Linels A Higuera Ancidey 02/19/2020, 11:19 PM

## 2020-02-20 DIAGNOSIS — Z98891 History of uterine scar from previous surgery: Secondary | ICD-10-CM | POA: Diagnosis not present

## 2020-02-20 LAB — COMPREHENSIVE METABOLIC PANEL
ALT: 16 U/L (ref 0–44)
AST: 26 U/L (ref 15–41)
Albumin: 3.1 g/dL — ABNORMAL LOW (ref 3.5–5.0)
Alkaline Phosphatase: 107 U/L (ref 38–126)
Anion gap: 9 (ref 5–15)
BUN: 8 mg/dL (ref 6–20)
CO2: 22 mmol/L (ref 22–32)
Calcium: 8.9 mg/dL (ref 8.9–10.3)
Chloride: 103 mmol/L (ref 98–111)
Creatinine, Ser: 0.73 mg/dL (ref 0.44–1.00)
GFR, Estimated: >60 mL/min (ref >60)
Glucose, Bld: 74 mg/dL (ref 70–99)
Potassium: 3.3 mmol/L — ABNORMAL LOW (ref 3.5–5.1)
Sodium: 134 mmol/L — ABNORMAL LOW (ref 135–145)
Total Bilirubin: 1.2 mg/dL (ref 0.3–1.2)
Total Protein: 5.8 g/dL — ABNORMAL LOW (ref 6.5–8.1)

## 2020-02-20 LAB — CBC
HCT: 25.8 % — ABNORMAL LOW (ref 36.0–46.0)
HCT: 26 % — ABNORMAL LOW (ref 36.0–46.0)
Hemoglobin: 9.1 g/dL — ABNORMAL LOW (ref 12.0–15.0)
Hemoglobin: 9.1 g/dL — ABNORMAL LOW (ref 12.0–15.0)
MCH: 33.8 pg (ref 26.0–34.0)
MCH: 34 pg (ref 26.0–34.0)
MCHC: 35.3 g/dL (ref 30.0–36.0)
MCHC: 35 g/dL (ref 30.0–36.0)
MCV: 95.9 fL (ref 80.0–100.0)
MCV: 97 fL (ref 80.0–100.0)
Platelets: 174 10*3/uL (ref 150–400)
Platelets: 159 10*3/uL (ref 150–400)
RBC: 2.69 MIL/uL — ABNORMAL LOW (ref 3.87–5.11)
RBC: 2.68 MIL/uL — ABNORMAL LOW (ref 3.87–5.11)
RDW: 13.9 % (ref 11.5–15.5)
RDW: 14.2 % (ref 11.5–15.5)
WBC: 11.1 10*3/uL — ABNORMAL HIGH (ref 4.0–10.5)
WBC: 12 10*3/uL — ABNORMAL HIGH (ref 4.0–10.5)
nRBC: 0 % (ref 0.0–0.2)
nRBC: 0 % (ref 0.0–0.2)

## 2020-02-20 LAB — CULTURE, BETA STREP (GROUP B ONLY): Strep Gp B Culture: NEGATIVE

## 2020-02-20 MED ORDER — FUROSEMIDE 10 MG/ML IJ SOLN
10.0000 mg | Freq: Once | INTRAMUSCULAR | Status: AC
  Administered 2020-02-20: 10 mg via INTRAVENOUS
  Filled 2020-02-20: qty 4

## 2020-02-20 MED ORDER — SODIUM CHLORIDE 0.9 % IV SOLN
510.0000 mg | Freq: Once | INTRAVENOUS | Status: AC
  Administered 2020-02-20: 510 mg via INTRAVENOUS
  Filled 2020-02-20: qty 17

## 2020-02-20 MED ORDER — LACTATED RINGERS IV BOLUS
500.0000 mL | Freq: Once | INTRAVENOUS | Status: AC
  Administered 2020-02-20: 500 mL via INTRAVENOUS

## 2020-02-20 NOTE — Progress Notes (Signed)
Subjective: Postpartum Day 1: Cesarean Delivery Patient reports nausea, vomiting and tolerating PO.    Objective: Vital signs in last 24 hours: Temp:  [97.6 F (36.4 C)-99 F (37.2 C)] 97.6 F (36.4 C) (12/05 0756) Pulse Rate:  [77-97] 77 (12/05 0756) Resp:  [11-29] 16 (12/05 0756) BP: (84-140)/(42-93) 96/52 (12/05 0756) SpO2:  [97 %-100 %] 98 % (12/05 0756) Weight:  [65.8 kg] 65.8 kg (12/04 1451)  Physical Exam:  General: alert, cooperative and no distress Lochia: appropriate Uterine Fundus: firm Incision: no significant drainage, no dehiscence, no significant erythema DVT Evaluation: No evidence of DVT seen on physical exam.  Recent Labs    02/19/20 2039 02/20/20 0540  HGB 10.7* 9.1*  HCT 30.3* 25.8*    Assessment/Plan: Status post Cesarean section. Doing well postoperatively.  Continue current care. Will do IV iron.   Rolm Bookbinder CNM 02/20/2020, 10:45 AM

## 2020-02-20 NOTE — Lactation Note (Signed)
This note was copied from a baby's chart. Lactation Consultation Note  Patient Name: Janet Rivers EBRAX'E Date: 02/20/2020 Reason for consult: Follow-up assessment;Mother's request;Difficult latch;Term;Infant weight loss P2, 30 hour term female infant with-3% weight loss, less than 6 lbs at birth.  LC entered the room, mom was crying, per mom, she was frustrated, she been trying latch infant and he would not latch. Mom was about to just formula fed infant only. LC ask if she could assist her with latch ing infant at the breast, mom was agreeable.   LC notice in flow sheet infant had been receiving formula only since 1305 today and had 4x feedings of only formula. Mom has been pre-pumping breast due to having inverted nipples prior to latching infant. LC fitted mom with 20 mm NS, pre-filled NS with 0.5 mls of similac with iron formula using a curve tip syringe.  Using the 20 mm NS, mom latched infant on her left breast using the foot ball hold position, infant latched with depth and swallows heard, infant was taken off to be burped and colostrum was present in NS. Mom re-latched infant and infant was still breastfeeding after 14 minutes when LC left the room, mom still plans to supplement infant with formula. LC suggested mom latch infant first for every feeding because she wants to BF infant and then after latching infant at breast supplement him with formula. LC gave mom breastfeeding supplemental sheet based on infant's age/ hours of life. LC suggested mom continue to use the 20 mm NS and tomorrow try latch infant again with out NS, LC did not set mom up with DEBP at this time as not to overwhelm mom and she has hand pump. Mom will continue to breastfeed infant according to cues, 8 to 12+ times within 24 hours, STS. Mom knows to call RN or LC if she needs further assistance with latching infant at the breast.   Maternal Data    Feeding Feeding Type: Breast Fed Nipple Type: Slow -  flow  LATCH Score Latch: Grasps breast easily, tongue down, lips flanged, rhythmical sucking. (Infant sustained latch with NS, mom has inverted nipples.)  Audible Swallowing: Spontaneous and intermittent  Type of Nipple: Everted at rest and after stimulation  Comfort (Breast/Nipple): Soft / non-tender  Hold (Positioning): Assistance needed to correctly position infant at breast and maintain latch.  LATCH Score: 9  Interventions Interventions: Breast feeding basics reviewed;Skin to skin;Assisted with latch;Breast massage;Position options;Support pillows;Adjust position;Breast compression  Lactation Tools Discussed/Used Tools: Nipple Shields Nipple shield size: 20   Consult Status Consult Status: Follow-up Date: 02/21/20 Follow-up type: In-patient    Danelle Earthly 02/20/2020, 11:07 PM

## 2020-02-21 DIAGNOSIS — O09899 Supervision of other high risk pregnancies, unspecified trimester: Secondary | ICD-10-CM

## 2020-02-21 DIAGNOSIS — Z2839 Other underimmunization status: Secondary | ICD-10-CM

## 2020-02-21 DIAGNOSIS — Z98891 History of uterine scar from previous surgery: Secondary | ICD-10-CM | POA: Diagnosis not present

## 2020-02-21 DIAGNOSIS — O99891 Other specified diseases and conditions complicating pregnancy: Secondary | ICD-10-CM

## 2020-02-21 MED ORDER — IBUPROFEN 800 MG PO TABS: 800.0000 mg | ORAL_TABLET | Freq: Three times a day (TID) | ORAL | 0 refills | Status: DC | PRN

## 2020-02-21 MED ORDER — OXYCODONE HCL 5 MG PO TABS
5.0000 mg | ORAL_TABLET | ORAL | 0 refills | Status: DC | PRN
Start: 2020-02-21 — End: 2020-10-30

## 2020-02-21 NOTE — Progress Notes (Signed)
Patient ID: Janet Rivers, female   DOB: [birth year], 24 y.o.   MRN: [id number] Late entry  Pt seen yesterday @ 12, noon  Attending Circumcision Counseling Progress Note  Patient desires circumcision for her female infant.  Circumcision procedure details discussed, risks and benefits of procedure were also discussed.  These include but are not limited to: Benefits of circumcision in men include reduction in the rates of urinary tract infection (UTI), penile cancer, some sexually transmitted infections, penile inflammatory and retractile disorders, as well as easier hygiene.  Risks include bleeding , infection, injury of glans which may lead to penile deformity or urinary tract issues, unsatisfactory cosmetic appearance and other potential complications related to the procedure.  It was emphasized that this is an elective procedure.  Patient wants to proceed with circumcision; written informed consent obtained.  Will do circumcision soon, routine circumcision and post circumcision care ordered for the infant.  Leianne Callins L. Alysia Penna, M.D. 02/21/2020 7:44 AM

## 2020-02-21 NOTE — Discharge Instructions (Signed)

## 2020-02-21 NOTE — Social Work (Signed)
CSW received consult for "other child living with grandmother in [place]". CSW met with MOB to assess and offer support. CSW entered room and observed FOB Janet Rivers sleeping on couch and MOB in bed, appearing to be sleep with newborn Janet Rivers.  CSW woke MOB up and introduced self and role. CSW education MOB on the importance of not co-sleeping with newborn. CSW asked MOB if she would like to speak alone for privacy. MOB declined and stated FOB could remain in room. MOB did not require an interpreter. CSW informed MOB of the reason for consult. MOB reported her daughter (Janet Rivers 07/29/2011) voluntarily lives in [place] with MOB mother (Janet Rivers) and that legally she still has all parental rights for her daughter. CSW assessed MOB for PMADs. MOB stated she has no mental health diagnosis and has been feeling tired but good since giving birth. MOB identified FOB and her sister as supports. MOB denies any current SI or HI.   CSW provided education regarding the baby blues period vs. perinatal mood disorders, discussed treatment and offered resources for mental health follow up if concerns arise. MOB declined. CSW recommends self-evaluation during the postpartum time period using the New Mom Checklist from Postpartum Progress and encouraged MOB to contact a medical professional if symptoms are noted at any time.    CSW provided review of Sudden Infant Death Syndrome (SIDS) precautions.  MOB reported newborn will sleep in a bassinet. MOB stated she has chosen a pediatrician but was unable to recall the name of it. MOB stated she has all of the essential needs for newborn, including a new carseat. MOB denies any transportation barriers to follow-up care and declined any additoanl community referrals.  CSW identifies no further need for intervention and no barriers to discharge at this time.  Math Brazie, LCSWA Clinical Social Work [facility] [phone] 

## 2020-02-21 NOTE — Lactation Note (Signed)
This note was copied from a baby's chart. Lactation Consultation Note  Patient Name: Boy Achol Azpeitia QIWLN'L Date: 02/21/2020 Reason for consult: Follow-up assessment;Term;Infant < 6lbs;Difficult latch Baby is 14 hours old / P2 / post circ ( baby was brought back to the room when [facility] was seeing mom , sound asleep ) . Per mom baby fed at 1030 30 ml.  Mom mentioned she was using a NS and needed to take it off,  LC showed mom how to take the NS off and noted the NS was snug .  LC will resize mom for the #24 NS.  In the mean time moms lunch came and she is eating lunch.  Will F/U.   Maternal Data    Feeding Feeding Type: Formula Nipple Type: Slow - flow  LATCH Score                   Interventions Interventions: Breast feeding basics reviewed  Lactation Tools Discussed/Used Tools: Shells;Pump Nipple shield size: 20;24 Shell Type: Inverted Breast pump type: Manual;Double-Electric Breast Pump WIC Program: Yes (per mom) Pump Review: Milk Storage   Consult Status Consult Status: Follow-up Date: 02/21/20 Follow-up type: In-patient    Matilde Sprang Casilda Pickerill 02/21/2020, 12:27 PM

## 2020-02-21 NOTE — Lactation Note (Signed)
This note was copied from a baby's chart. Lactation Consultation Note  Patient Name: Boy Salayah Meares [id number] Date: 02/21/2020 Reason for consult: Follow-up assessment;Term;Infant weight loss;Difficult latch;Infant < 6lbs;Other (Comment) (F/U to complete the D/C teaching) Baby is 45 hours old  2nd LC visit to complete the D/C teaching for Lactation.  Per mom had fed the baby a bottle at 1pm and baby took 17 ml.  Mom aware due to the age of the baby to make sure the baby is taking at least 30 ml per feeding . Baby is post circ - and is reason for taking 17 ml.  LC resized mom for her Nipple Shield - #20 NS ( LC noted to be to snug ),  And resized and #24 NS was a better fit.  LC recommended and instructed mom to instill EBM or formula into the top and  Latch the baby with firm support.  Feed the baby for 15- 20 mins and then supplement with 30 ml of EBM 1st and formula 2nd.  Feedings 8-12 times in 24 hours. ( LC reviewed the feeding goal is to feed with feeding cues and by 3 hours to feed the baby due to less than 6 pounds.  Both nipples noted to have intact abrasions on the front portion of the nipples.  LC instructed mom on the use of the comfort gels x 6 days after feeding or pumping alternating with shells while awake.  Sore nipple and engorgement prevention and tx reviewed.  Mom is active with [facility] [place] - LC gave mom a small list of what she needed to communicate to [facility] for need for DEBP .  Mom also has some form of  Blue shield and her DEBP was suppose to be sent to her on 11/11 and never came.  Mom tried calling the insurance company and was told the online service is off line today. LC recommended to go ahead and work on getting her [facility] loaner and LC will try for the 3 rd time to fax the request and mom aware.   LC offered to request and LC O/P appt with the clinic and mom receptive -  LC placed request in Epic.  Mom has the [facility] resource numbers.   Maternal Data    Feeding Feeding  Type:  (last fed at 1300) Nipple Type: Slow - flow  LATCH Score                   Interventions Interventions: Breast feeding basics reviewed  Lactation Tools Discussed/Used Tools: Shells;Pump;Flanges;Comfort gels;Nipple Shields Nipple shield size: 20;24 (#42 NS a better fit) Shell Type: Inverted Breast pump type: Manual;Double-Electric Breast Pump WIC Program: Yes Pump Review: Milk Storage   Consult Status Consult Status: Complete (mom verbally consented for the [facility] to send a request to the [facility] O/P) Date: 02/21/20 Follow-up type: In-patient    Matilde Sprang Roman Dubuc 02/21/2020, 2:17 PM

## 2020-02-24 ENCOUNTER — Ambulatory Visit: Payer: Medicaid Other

## 2020-03-01 ENCOUNTER — Ambulatory Visit: Payer: Medicaid Other

## 2020-03-02 ENCOUNTER — Ambulatory Visit: Payer: Medicaid Other

## 2020-03-08 ENCOUNTER — Ambulatory Visit (INDEPENDENT_AMBULATORY_CARE_PROVIDER_SITE_OTHER): Payer: Medicaid Other | Admitting: *Deleted

## 2020-03-08 ENCOUNTER — Other Ambulatory Visit: Payer: Self-pay

## 2020-03-08 VITALS — BP 106/63 | HR 84 | Temp 98.0°F | Wt 127.4 lb

## 2020-03-08 DIAGNOSIS — Z4889 Encounter for other specified surgical aftercare: Secondary | ICD-10-CM

## 2020-03-08 NOTE — Progress Notes (Signed)
   Subjective:     Janet Rivers is a 24 y.o. female who presents to the clinic 3 weeks status post cesarean delivery on 02/19/20 at [redacted]w[redacted]d gestation.  Eating a regular diet without difficulty. Bowel movements are normal. The patient is not having any pain.  The following portions of the patient's history were reviewed and updated as appropriate: allergies, current medications, past medical history, past surgical history and problem list.  Review of Systems Pertinent items are noted in HPI.    Objective:    BP 106/63 (BP Location: Left Arm, Patient Position: Sitting, Cuff Size: Normal)   Pulse 84   Temp 98 F (36.7 C) (Oral)   Wt 127 lb 6.4 oz (57.8 kg)   LMP 03/14/2019   Breastfeeding Yes   BMI 21.87 kg/m  General:  alert, cooperative, appears stated age and no distress  Abdomen: soft, non-tender  Incision:   healing well, no drainage, no erythema, no hernia, no seroma, no swelling, no dehiscence, incision well approximated. Patient had 12 steri strips in placed. 12 Steri strips were removed without difficulty.     Assessment:    Doing well postoperatively.   Plan:    1. Continue any current medications. 2. Wound care discussed. 3. Activity restrictions: no lifting more than 25 pounds 4. Anticipated return to work: not applicable. 5. Follow up: 3 weeks for postpartum.   Clovis Pu, RN

## 2020-03-09 DIAGNOSIS — Z3480 Encounter for supervision of other normal pregnancy, unspecified trimester: Secondary | ICD-10-CM | POA: Diagnosis not present

## 2020-03-29 ENCOUNTER — Other Ambulatory Visit: Payer: Self-pay

## 2020-03-29 ENCOUNTER — Ambulatory Visit (INDEPENDENT_AMBULATORY_CARE_PROVIDER_SITE_OTHER): Payer: Medicaid Other

## 2020-03-29 DIAGNOSIS — Z7251 High risk heterosexual behavior: Secondary | ICD-10-CM

## 2020-03-29 DIAGNOSIS — Z98891 History of uterine scar from previous surgery: Secondary | ICD-10-CM | POA: Diagnosis not present

## 2020-03-29 DIAGNOSIS — F53 Postpartum depression: Secondary | ICD-10-CM | POA: Diagnosis not present

## 2020-03-29 DIAGNOSIS — O99345 Other mental disorders complicating the puerperium: Secondary | ICD-10-CM

## 2020-03-29 MED ORDER — LEVONORGESTREL 1.5 MG PO TABS: 1.5000 mg | ORAL_TABLET | Freq: Once | ORAL | 0 refills | Status: AC

## 2020-03-29 NOTE — Progress Notes (Signed)
Post Partum Visit Note  Janet Rivers is a 25 y.o. [id number] female who presents for a postpartum visit. She is 6 weeks postpartum following a repeat cesarean section.  I have fully reviewed the prenatal and intrapartum course. The delivery was at 41 gestational weeks.  Anesthesia: spinal. Postpartum course has been uncomplicated. Baby is doing well and is present. Baby is feeding by bottle - Octavia Heir. Bleeding no bleeding. Bowel function is normal. Bladder function is normal. Patient is sexually active. Contraception method is none. Postpartum depression screening: positive: score 10.  Patient endorses some depression and contributes it to inability to pay bills and afford infant care/hygiene needs. Patient reports she does not have any social support here and the majority of her family is in [place]. Patient endorses safety at home and denies DV/A. Patient reports unprotected sexual activity last night, but also reports sexual activity prior to then that was also without protection.    The pregnancy intention screening data noted above was reviewed. Potential methods of contraception were discussed. The patient elected to proceed with Unknown/Not Reported.    Edinburgh Postnatal Depression Scale - 03/29/20 1325      Edinburgh Postnatal Depression Scale:  In the Past 7 Days   I have been able to laugh and see the funny side of things. 1    I have looked forward with enjoyment to things. 1    I have blamed myself unnecessarily when things went wrong. 2    I have been anxious or worried for no good reason. 1    I have felt scared or panicky for no good reason. 1    Things have been getting on top of me. 0    I have been so unhappy that I have had difficulty sleeping. 2    I have felt sad or miserable. 1    I have been so unhappy that I have been crying. 1    The thought of harming myself has occurred to me. 0    Edinburgh Postnatal Depression Scale Total 10             The following portions of the patient's history were reviewed and updated as appropriate: allergies, current medications, past family history, past medical history, past social history, past surgical history and problem list.  Review of Systems Pertinent items are noted in HPI.    Objective:  BP 100/64 (BP Location: Left Arm, Patient Position: Sitting, Cuff Size: Normal)   Pulse 87   Temp 97.6 F (36.4 C) (Oral)   Ht 5\' 4"  (1.626 m)   Wt 124 lb 6.4 oz (56.4 kg)   LMP 03/14/2019   Breastfeeding No   BMI 21.35 kg/m    General:  alert, cooperative and Disheveled   Breasts:  Not Examined  Lungs: clear to auscultation bilaterally  Heart:  regular rate and rhythm  Abdomen: soft, non-tender; bowel sounds normal; no masses,  no organomegaly Incision intake and well healed.  No erythema, tenderness, or drainage.    Vulva:  not evaluated  Vagina: not evaluated  Cervix:  Not Evaluated  Corpus: not examined  Adnexa:  not evaluated  Rectal Exam: Not performed.        Assessment:   4 postpartum exam S/P Repeat C/S Normal Involution  Bottlefeeding Pap smear UTD Mild Depression Sexually Active  Plan:   Reviewed birth control methods.  Discussed how provider unable to initiate any method, even Paragard IUD, due to previous UPS. Informed that she  can come back in two weeks and receive BCM if UPT negative. Instructed to utilize condoms or abstain from sex for at least 2 weeks prior to next appt.  Patient verbalized understanding.  Patient offered and declines condoms.  Will give One Step/Plan B script for most recent intercourse. Reviewed depression and how SD are contributing. Discussed referral to ambulatory behavioral health. Informed that LCSW may also be able to offer resources for identified social issues. Okay to return to normal activities.   Essential components of care per ACOG recommendations:  1.  Mood and well being: Patient with positive depression screening  today. Reviewed local resources for support.  - Patient does not use tobacco. - hx of drug use? No     2. Infant care and feeding:  -Patient currently breastmilk feeding? No *Patient denies breast issues.  -Social determinants of health (SDOH) reviewed in EPIC. Depression, Housing, Infant Needs  3. Sexuality, contraception and birth spacing - Patient does not want a pregnancy in the next year.  Desired family size is unsure children.  - Reviewed forms of contraception in tiered fashion. Patient desired Unsure today.   - Discussed birth spacing of 18 months  4. Sleep and fatigue -Encouraged family/partner/community support of 4 hrs of uninterrupted sleep to help with mood and fatigue. *Patient thinks she gets about 4 hours.  States she sleeps when baby sleeps.   5. Physical Recovery  - Discussed patients delivery and complications. Reports delivery went "good."  - Patient had repeat c/s.  Incision appears well healed. - Patient has urinary incontinence? No - Patient is safe to resume physical and sexual activity  6.  Health Maintenance - Last pap smear done August 2021 and was normal with negative HPV. No Mammogram d/t age  31. No Chronic Disease - PCP follow up  Cherre Robins, CNM [facility] [employer]

## 2020-03-30 NOTE — Patient Instructions (Signed)

## 2020-04-12 ENCOUNTER — Other Ambulatory Visit: Payer: Self-pay

## 2020-04-12 ENCOUNTER — Ambulatory Visit (INDEPENDENT_AMBULATORY_CARE_PROVIDER_SITE_OTHER): Payer: Medicaid Other

## 2020-04-12 VITALS — BP 103/62 | HR 86 | Temp 97.8°F | Wt 124.8 lb

## 2020-04-12 DIAGNOSIS — Z30430 Encounter for insertion of intrauterine contraceptive device: Secondary | ICD-10-CM | POA: Insufficient documentation

## 2020-04-12 HISTORY — DX: Encounter for insertion of intrauterine contraceptive device: Z30.430

## 2020-04-12 LAB — POCT URINE PREGNANCY: Preg Test, Ur: NEGATIVE

## 2020-04-12 MED ORDER — PARAGARD INTRAUTERINE COPPER IU IUD: INTRAUTERINE_SYSTEM | Freq: Once | INTRAUTERINE | Status: AC

## 2020-04-12 NOTE — Progress Notes (Signed)
    GYNECOLOGY OFFICE PROCEDURE NOTE  Janet Rivers is a 25 y.o. [id number] here for Pargard IUD insertion. No GYN concerns.  Last pap smear was on August 2021 and was normal.  IUD Insertion Procedure Note IUD: Paragard  Exp: April 2027   Lot: [id number] SN: [id number] NDC: [id number]  Patient identified, informed consent performed, consent signed.   Discussed risks of irregular bleeding, cramping, infection, malpositioning or misplacement of the IUD outside the uterus which may require further procedure such as laparoscopy. Time out was performed.  Urine pregnancy test negative.  Bimanual exam performed and uterus of normal size, non-tender, and retroverted position. Speculum placed in the vagina and cervix visualized.  Cervix and vaginal walls cleaned x 2 with betadine swabs. Anterior aspect grasped with a single tooth tenaculum.  Uterus sounded to 8 cm.  Paragard IUD placed per manufacturer's recommendations via sterile procedure.  Strings trimmed to ~3 cm. Tenaculum was removed, good hemostasis with pressure noted.  Patient tolerated procedure well.   Patient was given post-procedure instructions.  She was advised to have abstain from sexual activity for one week to reduce infection risks.  Patient instructed to follow up in 4 weeks for IUD check and call and/or report any issues prior to next visit.  Cherre Robins, CNM 04/12/2020

## 2020-04-12 NOTE — Addendum Note (Signed)
Addended by: Clovis Pu on: 04/12/2020 11:32 AM   Modules accepted: Orders

## 2020-04-12 NOTE — Patient Instructions (Signed)

## 2020-05-12 ENCOUNTER — Ambulatory Visit: Payer: Medicaid Other | Admitting: Advanced Practice Midwife

## 2020-06-06 ENCOUNTER — Telehealth: Payer: Self-pay

## 2020-06-06 NOTE — Telephone Encounter (Signed)
Attempted to reach patient about scheduling an appointment with [facility]. Left a voicemail message for her to call back today.

## 2020-06-28 ENCOUNTER — Ambulatory Visit: Payer: Medicaid Other

## 2020-08-09 ENCOUNTER — Encounter: Payer: Self-pay | Admitting: Obstetrics and Gynecology

## 2020-08-09 ENCOUNTER — Ambulatory Visit (INDEPENDENT_AMBULATORY_CARE_PROVIDER_SITE_OTHER): Payer: Medicaid Other | Admitting: Obstetrics and Gynecology

## 2020-08-09 ENCOUNTER — Other Ambulatory Visit: Payer: Self-pay

## 2020-08-09 VITALS — BP 107/65 | HR 92 | Temp 98.2°F | Ht 64.0 in | Wt 120.6 lb

## 2020-08-09 DIAGNOSIS — Z3042 Encounter for surveillance of injectable contraceptive: Secondary | ICD-10-CM

## 2020-08-09 DIAGNOSIS — Z30432 Encounter for removal of intrauterine contraceptive device: Secondary | ICD-10-CM | POA: Diagnosis not present

## 2020-08-09 MED ORDER — MEDROXYPROGESTERONE ACETATE 150 MG/ML IM SUSP: 150.0000 mg | INTRAMUSCULAR | 3 refills | Status: DC

## 2020-08-09 MED ORDER — MEDROXYPROGESTERONE ACETATE 150 MG/ML IM SUSP
150.0000 mg | Freq: Once | INTRAMUSCULAR | Status: AC
  Administered 2020-08-09: 150 mg via INTRAMUSCULAR

## 2020-08-09 NOTE — Progress Notes (Signed)
   Subjective:  Pt in for Depo Provera injection.    Objective: Need for contraception. No unusual complaints.    Assessment: Pt tolerated Depo injection. Depo given Right Deltoid.   Plan:  Next injection due 10/25/20-11/08/20.    Clovis Pu, RN

## 2020-08-10 ENCOUNTER — Encounter: Payer: Self-pay | Admitting: Obstetrics and Gynecology

## 2020-08-10 NOTE — Progress Notes (Signed)
   IUD Removal Procedure Note   Patient is 25 y.o. [id number] who is here for Paragard IUD removal. She would like IUD removed secondary to heavy vaginal bleeding. She has had no issues with IUD. She understands that she could get pregnant after removal of IUD if she does not use another form of contraception. She has no other complaints today. Reviewed risks of removal including pain, bleeding, difficult removal and inability to remove IUD which may require surgical removal in OR. She affirms that she would like IUD removed.  BP 107/65 (BP Location: Left Arm, Patient Position: Sitting, Cuff Size: Normal)   Pulse 92   Temp 98.2 F (36.8 C) (Oral)   Ht 5\' 4"  (1.626 m)   Wt 120 lb 9.6 oz (54.7 kg)   Breastfeeding Yes   BMI 20.70 kg/m   Patient with normal appearing external female genitalia. Pederson speculum placed in vagina and IUD strings easily visualized. Strings grasped with ring forceps and removed easily. Minimal bleeding noted. All instruments removed from vagina. Patient tolerated procedure very well.   1. Encounter for IUD removal - She was given post removal instructions.  2. Family planning, Depo-Provera contraception monitoring/administration  - medroxyPROGESTERone (DEPO-PROVERA) injection 150 mg NOW,  - Rx for medroxyPROGESTERone (DEPO-PROVERA) 150 MG/ML injection   Return 1 year for annual or prn.   , MSN, CNM 08/09/2020 1:20 PM

## 2020-08-10 NOTE — Progress Notes (Deleted)
  GYNECOLOGY PROGRESS NOTE  History:  Ms. Markiesha Delia is a 25 y.o. [id number] presents to [facility] office today for problem gyn visit. She reports she is dissatisfied with her bleeding profile.  She denies h/a, dizziness, shortness of breath, n/v, or fever/chills.    The following portions of the patient's history were reviewed and updated as appropriate: allergies, current medications, past family history, past medical history, past social history, past surgical history and problem list. Last pap smear on 10/21/2019 was normal.  Review of Systems:  Pertinent items are noted in HPI.   Objective:  Physical Exam Blood pressure 107/65, pulse 92, temperature 98.2 F (36.8 C), temperature source Oral, height 5\' 4"  (1.626 m), weight 120 lb 9.6 oz (54.7 kg), currently breastfeeding. VS reviewed, nursing note reviewed,  Constitutional: well developed, well nourished, no distress HEENT: normocephalic CV: normal rate Pulm/chest wall: normal effort Breast Exam: deferred Abdomen: soft Neuro: alert and oriented x 3 Skin: warm, dry Psych: affect normal Pelvic exam: Cervix pink, visually closed, without lesion, scant white creamy discharge, vaginal walls and external genitalia normal Bimanual exam: Cervix 0/long/high, firm, anterior, neg CMT, uterus nontender, non-enlarged, adnexa without tenderness, enlargement, or mass *See procedural note for IUD removal  Assessment & Plan:  1. Encounter for IUD removal  2. Family planning, Depo-Provera contraception monitoring/administration  - medroxyPROGESTERone (DEPO-PROVERA) injection 150 mg NOW  - Rx for medroxyPROGESTERone (DEPO-PROVERA) 150 MG/ML injection  , CNM 1: 20 PM

## 2020-10-30 ENCOUNTER — Ambulatory Visit: Payer: Medicaid Other

## 2020-10-30 ENCOUNTER — Other Ambulatory Visit: Payer: Self-pay | Admitting: *Deleted

## 2020-10-30 ENCOUNTER — Other Ambulatory Visit: Payer: Self-pay

## 2020-10-30 ENCOUNTER — Ambulatory Visit (INDEPENDENT_AMBULATORY_CARE_PROVIDER_SITE_OTHER): Payer: Medicaid Other | Admitting: *Deleted

## 2020-10-30 VITALS — BP 101/66 | HR 80 | Temp 98.1°F | Ht 64.0 in | Wt 125.0 lb

## 2020-10-30 DIAGNOSIS — Z3042 Encounter for surveillance of injectable contraceptive: Secondary | ICD-10-CM

## 2020-10-30 MED ORDER — MEDROXYPROGESTERONE ACETATE 150 MG/ML IM SUSP: 150.0000 mg | INTRAMUSCULAR | 3 refills | Status: DC

## 2020-10-30 MED ORDER — MEDROXYPROGESTERONE ACETATE 150 MG/ML IM SUSP
150.0000 mg | Freq: Once | INTRAMUSCULAR | Status: AC
  Administered 2020-10-30: 150 mg via INTRAMUSCULAR

## 2020-10-30 NOTE — Progress Notes (Signed)
   Subjective:  Pt in for Depo Provera injection.    Objective: Need for contraception. No unusual complaints.    Assessment: Pt tolerated Depo injection. Depo given Left Deltoid.  Plan:  Next injection due 01/15/2021-01/29/2021.    Clovis Pu, RN

## 2021-01-15 ENCOUNTER — Ambulatory Visit: Payer: Medicaid Other

## 2021-01-16 ENCOUNTER — Ambulatory Visit: Payer: Medicaid Other

## 2021-01-18 ENCOUNTER — Other Ambulatory Visit: Payer: Self-pay

## 2021-01-18 ENCOUNTER — Ambulatory Visit (INDEPENDENT_AMBULATORY_CARE_PROVIDER_SITE_OTHER): Payer: Medicaid Other | Admitting: *Deleted

## 2021-01-18 VITALS — BP 100/63 | HR 83 | Temp 97.3°F | Ht 64.0 in | Wt 129.2 lb

## 2021-01-18 DIAGNOSIS — Z3042 Encounter for surveillance of injectable contraceptive: Secondary | ICD-10-CM | POA: Diagnosis not present

## 2021-01-18 MED ORDER — MEDROXYPROGESTERONE ACETATE 150 MG/ML IM SUSP
150.0000 mg | Freq: Once | INTRAMUSCULAR | Status: AC
Start: 2021-01-18 — End: 2021-01-18
  Administered 2021-01-18: 150 mg via INTRAMUSCULAR

## 2021-01-18 NOTE — Progress Notes (Signed)
   Subjective:  Pt in for Depo Provera injection.    Objective: Need for contraception. No unusual complaints.    Assessment: Pt tolerated Depo injection. Depo given Left Deltoid.   Plan:  Next injection due 04/06/2021-04/19/2021.    Clovis Pu, RN

## 2021-04-03 IMAGING — US US OB TRANSVAGINAL
1 series · 15 of 28 positions shown · non-contrast
Comparison: 05/20/2019

CLINICAL DATA: Bleeding and cramping. History of elevated beta HCG,
gestational age by last menstrual period 9 weeks and 5 days, last
menstrual period of 03/14/2019 with previous ultrasound showing
probable gestational sac in the uterine fundus.

EXAM:
OBSTETRIC <14 WK ULTRASOUND
TECHNIQUE: Transabdominal ultrasound was performed for evaluation of the
gestation as well as the maternal uterus and adnexal regions.

[Series 1: us ob transvaginal · 15 of 30 slices shown]
[im 1/30]
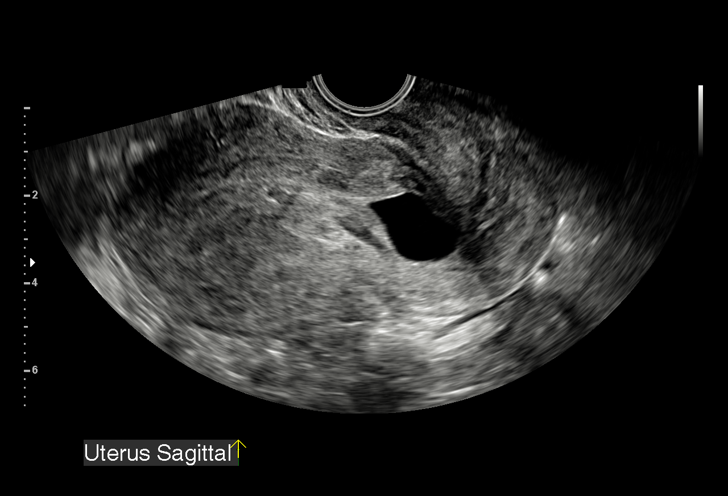
[im 3/30]
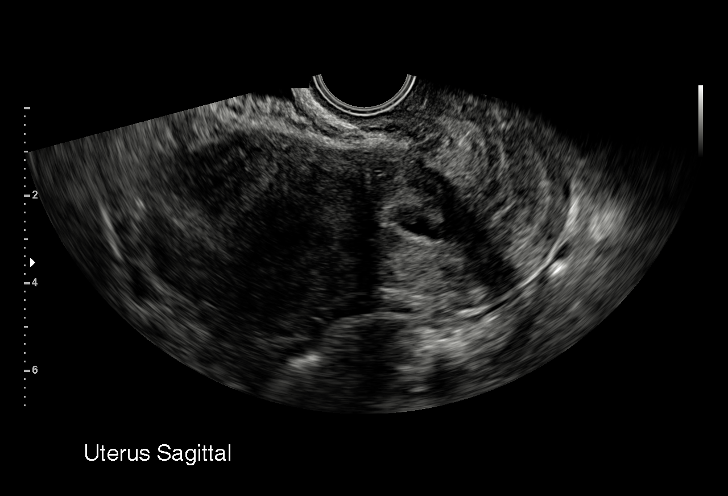
[im 5/30]
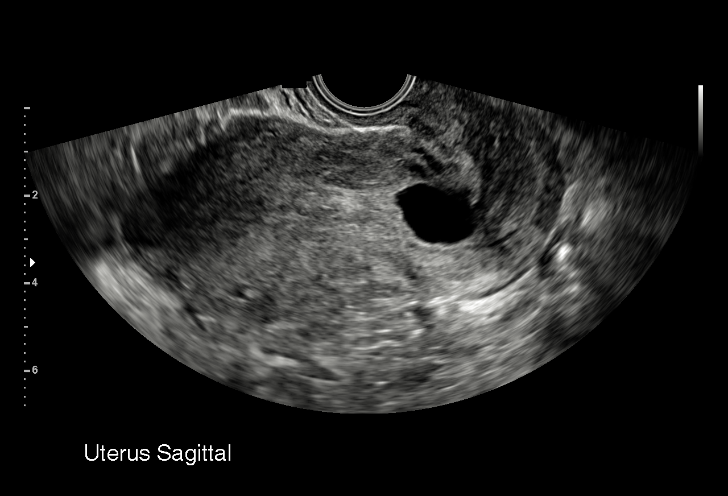
[im 7/30]
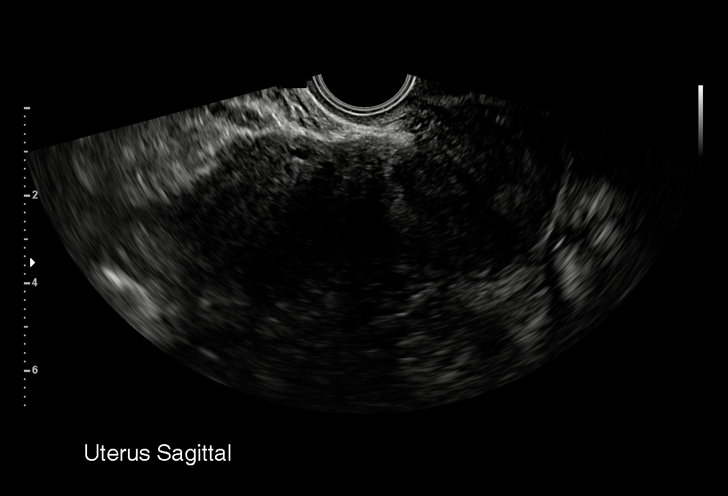
[im 9/30]
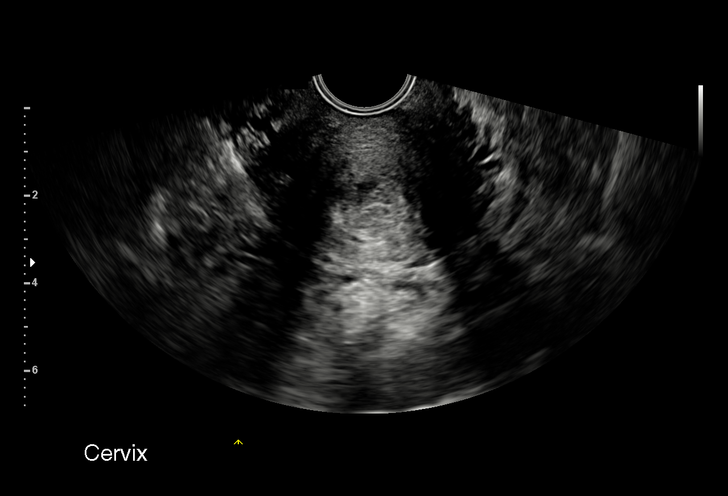
[im 11/30]
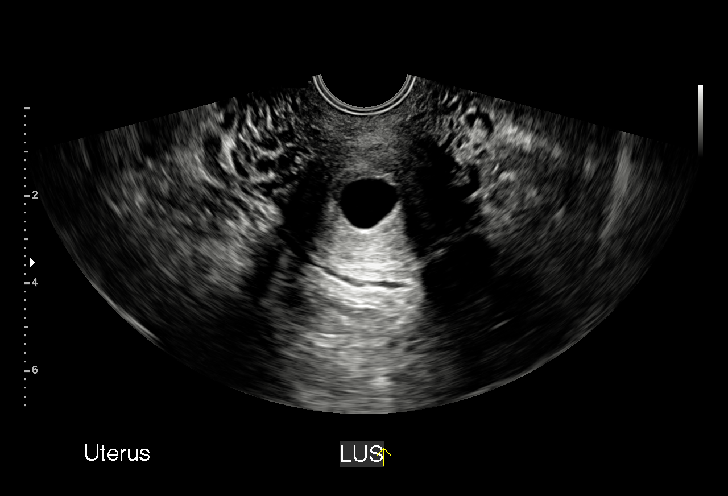
[im 13/30]
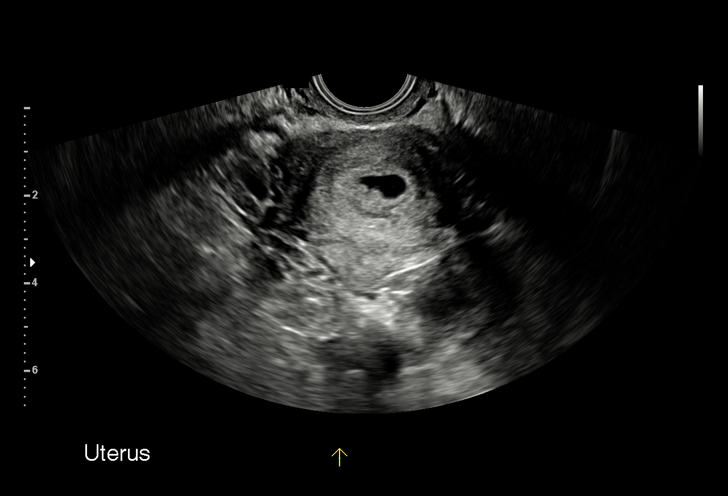
[im 16/30]
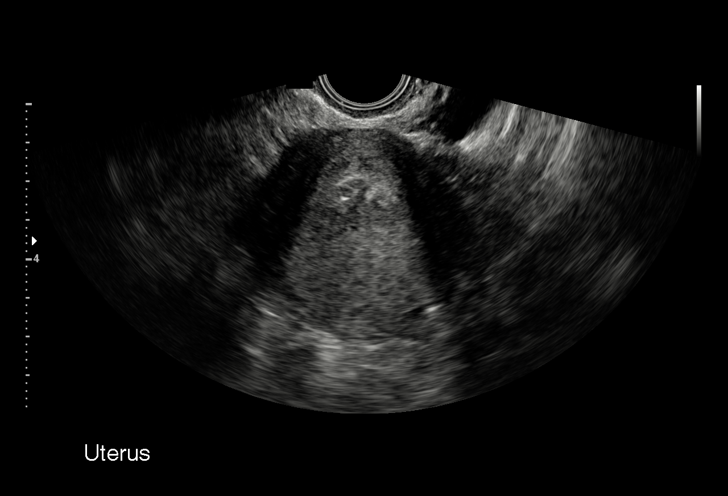
[im 17/30]
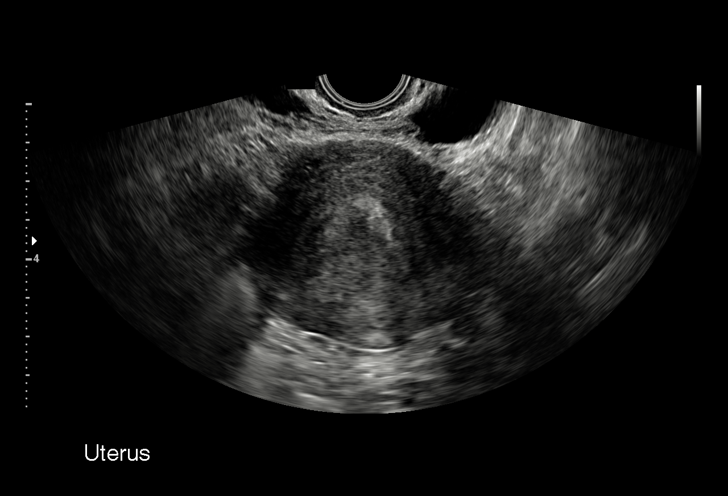
[im 19/30]
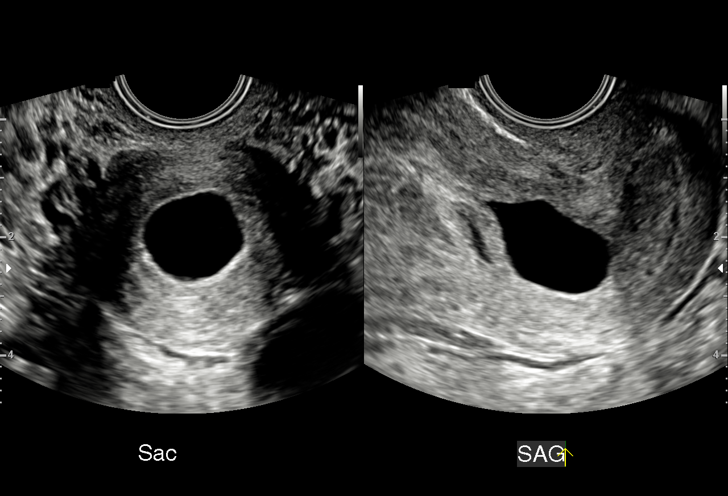
[im 21/30]
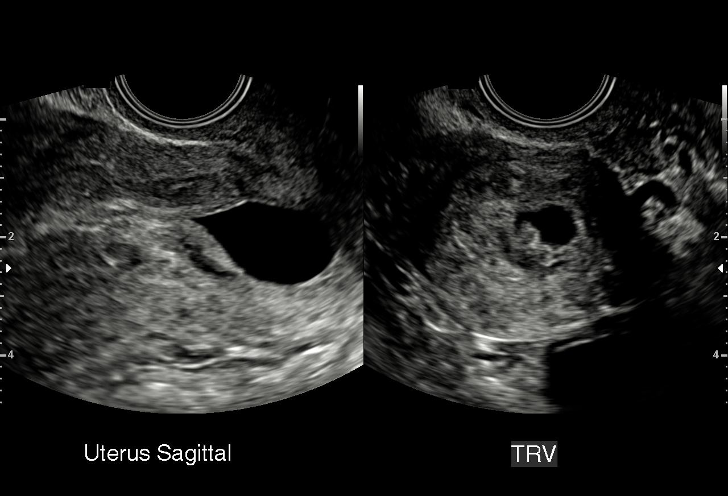
[im 23/30]
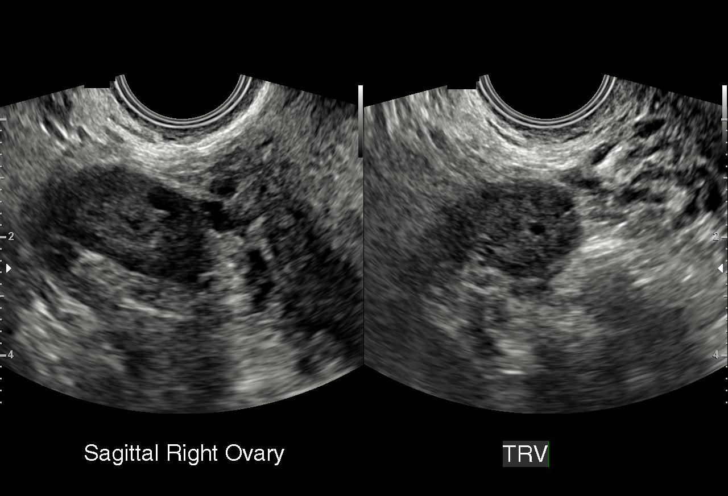
[im 25/30]
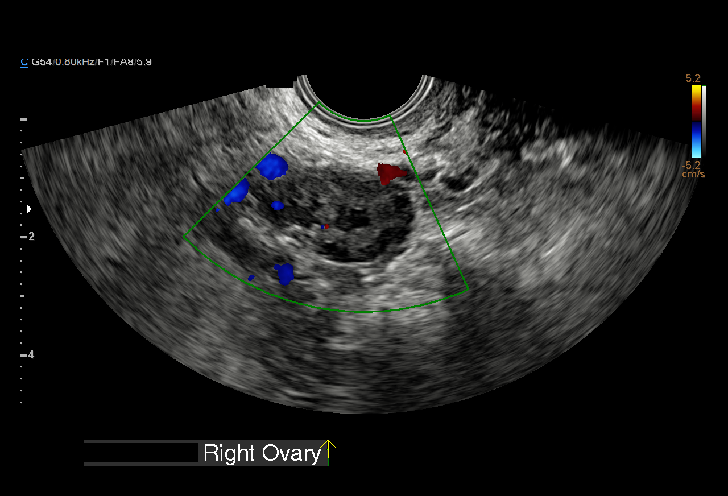
[im 27/30]
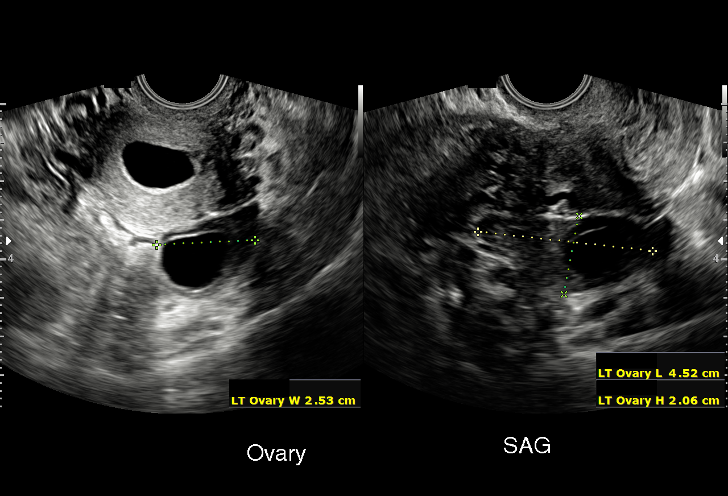
[im 30/30]
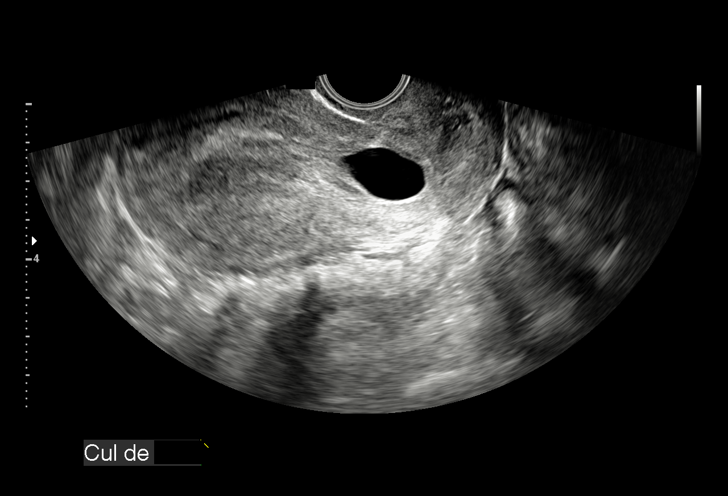

[15 of 28 positions shown; findings below may reference images not displayed]

FINDINGS: Intrauterine gestational sac: Single potential gestational sac in
the lower uterine segment without convincing decidual reaction.

Yolk sac:  Not visualized

Embryo:  Not visualized

MSD:  17.6 mm   6 w   4 d

Subchorionic hemorrhage: Small subchorionic hemorrhage suspected
along the superior margin of the ovoid potential gestational sac
that has migrated from the fundus to the level of the lower uterine
segment. This fluid in the lower uterine segment does not show
convincing evidence of decidual reaction, more suggestive on the
previous imaging study.

Maternal uterus/adnexae: Corpus luteum cyst in the left ovary.
Ovaries are otherwise unremarkable.

No signs of free fluid.
IMPRESSION: Findings are suspicious but not yet definitive for failed pregnancy.

The change in position suggests that this may represent abortion in
progress. Given that there are no definitive signs of gestation on
this or the prior study i.e. fetal pole or yolk sac and no current
definitive signs of double decidual sign would recommend continued
follow-up with ultrasound in 7-10 days or as indicated and continued
correlation with beta HCG.

No adnexal mass or free fluid.

## 2021-04-09 ENCOUNTER — Ambulatory Visit: Payer: Medicaid Other

## 2021-04-10 ENCOUNTER — Ambulatory Visit: Payer: Medicaid Other

## 2021-04-16 ENCOUNTER — Ambulatory Visit (INDEPENDENT_AMBULATORY_CARE_PROVIDER_SITE_OTHER): Payer: Medicaid Other | Admitting: *Deleted

## 2021-04-16 ENCOUNTER — Other Ambulatory Visit: Payer: Self-pay

## 2021-04-16 VITALS — BP 105/61 | HR 86 | Temp 98.1°F | Ht 64.0 in | Wt 131.4 lb

## 2021-04-16 DIAGNOSIS — Z3042 Encounter for surveillance of injectable contraceptive: Secondary | ICD-10-CM

## 2021-04-16 MED ORDER — MEDROXYPROGESTERONE ACETATE 150 MG/ML IM SUSP
150.0000 mg | Freq: Once | INTRAMUSCULAR | Status: AC
  Administered 2021-04-16: 150 mg via INTRAMUSCULAR

## 2021-04-16 NOTE — Progress Notes (Signed)
° °  Subjective:  Pt in for Depo Provera injection.    Objective: Need for contraception. No unusual complaints.    Assessment: Pt tolerated Depo injection. Depo given Left Deltoid.   Plan:  Next injection due 07/02/2021-07/16/2021.    Clovis Pu, RN

## 2021-07-02 ENCOUNTER — Ambulatory Visit: Payer: Medicaid Other

## 2021-07-04 ENCOUNTER — Ambulatory Visit (INDEPENDENT_AMBULATORY_CARE_PROVIDER_SITE_OTHER): Payer: Medicaid Other | Admitting: General Practice

## 2021-07-04 VITALS — BP 97/61 | HR 81 | Ht 64.0 in | Wt 129.0 lb

## 2021-07-04 DIAGNOSIS — Z3042 Encounter for surveillance of injectable contraceptive: Secondary | ICD-10-CM | POA: Diagnosis not present

## 2021-07-04 MED ORDER — MEDROXYPROGESTERONE ACETATE 150 MG/ML IM SUSP
150.0000 mg | Freq: Once | INTRAMUSCULAR | Status: AC
  Administered 2021-07-04: 150 mg via INTRAMUSCULAR

## 2021-07-04 NOTE — Progress Notes (Signed)
Janet Rivers here for Depo-Provera Injection. Injection administered without complication. Patient will return in 3 months for next injection between July 5 and July 19. Next annual visit due May 2023.  ? ?Derinda Late, RN ?07/04/2021  3:30 PM  ?

## 2021-09-20 ENCOUNTER — Ambulatory Visit: Payer: Medicaid Other

## 2021-09-25 ENCOUNTER — Ambulatory Visit: Payer: Medicaid Other

## 2022-03-12 ENCOUNTER — Inpatient Hospital Stay (HOSPITAL_COMMUNITY): Payer: Medicaid Other

## 2022-03-12 ENCOUNTER — Encounter (HOSPITAL_COMMUNITY): Payer: Self-pay | Admitting: *Deleted

## 2022-03-12 ENCOUNTER — Inpatient Hospital Stay (HOSPITAL_COMMUNITY)
Admission: AD | Admit: 2022-03-12 | Discharge: 2022-03-12 | Disposition: A | Discharge status: Home / Self Care | Payer: Medicaid Other | Attending: Obstetrics & Gynecology | Admitting: Obstetrics & Gynecology

## 2022-03-12 DIAGNOSIS — Z3A08 8 weeks gestation of pregnancy: Secondary | ICD-10-CM

## 2022-03-12 DIAGNOSIS — N8312 Corpus luteum cyst of left ovary: Secondary | ICD-10-CM | POA: Diagnosis not present

## 2022-03-12 DIAGNOSIS — Z6791 Unspecified blood type, Rh negative: Secondary | ICD-10-CM | POA: Diagnosis not present

## 2022-03-12 DIAGNOSIS — O26891 Other specified pregnancy related conditions, first trimester: Secondary | ICD-10-CM | POA: Diagnosis not present

## 2022-03-12 DIAGNOSIS — O208 Other hemorrhage in early pregnancy: Secondary | ICD-10-CM | POA: Diagnosis not present

## 2022-03-12 DIAGNOSIS — O26851 Spotting complicating pregnancy, first trimester: Secondary | ICD-10-CM | POA: Diagnosis not present

## 2022-03-12 DIAGNOSIS — O09291 Supervision of pregnancy with other poor reproductive or obstetric history, first trimester: Secondary | ICD-10-CM | POA: Diagnosis not present

## 2022-03-12 DIAGNOSIS — Z3490 Encounter for supervision of normal pregnancy, unspecified, unspecified trimester: Secondary | ICD-10-CM

## 2022-03-12 LAB — CBC
HCT: 37.1 % (ref 36.0–46.0)
Hemoglobin: 12.6 g/dL (ref 12.0–15.0)
MCH: 32.3 pg (ref 26.0–34.0)
MCHC: 34 g/dL (ref 30.0–36.0)
MCV: 95.1 fL (ref 80.0–100.0)
Platelets: 250 10*3/uL (ref 150–400)
RBC: 3.9 MIL/uL (ref 3.87–5.11)
RDW: 12.4 % (ref 11.5–15.5)
WBC: 7.5 10*3/uL (ref 4.0–10.5)
nRBC: 0 % (ref 0.0–0.2)

## 2022-03-12 LAB — URINALYSIS, ROUTINE W REFLEX MICROSCOPIC
Bilirubin Urine: NEGATIVE
Glucose, UA: NEGATIVE mg/dL
Ketones, ur: NEGATIVE mg/dL
Leukocytes,Ua: NEGATIVE
Nitrite: NEGATIVE
Protein, ur: NEGATIVE mg/dL
Specific Gravity, Urine: 1.018 (ref 1.005–1.030)
pH: 7 (ref 5.0–8.0)

## 2022-03-12 LAB — WET PREP, GENITAL
Clue Cells Wet Prep HPF POC: NONE SEEN
Sperm: NONE SEEN
Trich, Wet Prep: NONE SEEN
WBC, Wet Prep HPF POC: >=10 — AB (ref <10)
Yeast Wet Prep HPF POC: NONE SEEN

## 2022-03-12 LAB — POCT PREGNANCY, URINE: Preg Test, Ur: POSITIVE — AB

## 2022-03-12 LAB — HCG, QUANTITATIVE, PREGNANCY: hCG, Beta Chain, Quant, S: 164445 m[IU]/mL — ABNORMAL HIGH (ref <5)

## 2022-03-12 MED ORDER — PREPLUS 27-1 MG PO TABS: 1.0000 | ORAL_TABLET | Freq: Every day | ORAL | 13 refills | Status: AC

## 2022-03-12 MED ORDER — RHO D IMMUNE GLOBULIN 1500 UNIT/2ML IJ SOSY
300.0000 ug | PREFILLED_SYRINGE | Freq: Once | INTRAMUSCULAR | Status: AC
  Administered 2022-03-12: 300 ug via INTRAMUSCULAR
  Filled 2022-03-12: qty 2

## 2022-03-12 NOTE — MAU Provider Note (Signed)
History     CSN: [id number]  Arrival date and time: 03/12/22 1430   Event Date/Time   First Provider Initiated Contact with Patient 03/12/22 1736      Chief Complaint  Patient presents with   Vaginal Bleeding   Possible Pregnancy   HPI Janet Rivers is a 26 y.o. G4P2012 at 7 weeks by LMP. She presents to MAU for evaluation of vaginal spotting. This is a new problem, onset two weeks ago. Patient thought she was having an atypical menstrual cycle then obtained a positive home pregnancy test.  Pain score 0/10 today but patient reports mild low back pack when she had a long day of shopping.  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    Family History  Problem Relation Age of Onset   Healthy Father    Healthy Mother     Social History   Tobacco Use   Smoking status: Never   Smokeless tobacco: Never  Vaping Use   Vaping Use: Never used  Substance Use Topics   Alcohol use: Not Currently    Comment: occ.    Drug use: No    Allergies: No Known Allergies  Medications Prior to Admission  Medication Sig Dispense Refill Last Dose   medroxyPROGESTERone  (DEPO-PROVERA) 150 MG/ML injection Inject 1 mL (150 mg total) into the muscle every 3 (three) months. 1 mL 3     Review of Systems  Genitourinary:  Positive for vaginal bleeding.  All other systems reviewed and are negative.  Physical Exam   Blood pressure (!) 111/58, pulse 81, temperature 98.2 F (36.8 C), temperature source Oral, resp. rate 17, height 5\' 4"  (1.626 m), weight 63.5 kg, last menstrual period 01/20/2022, SpO2 100 %.  Physical Exam Vitals and nursing note reviewed. Exam conducted with a chaperone present.  Constitutional:      Appearance: Normal appearance.  Cardiovascular:     Rate and Rhythm: Normal rate.     Pulses: Normal pulses.  Pulmonary:     Effort: Pulmonary effort is normal.  Abdominal:     General: Abdomen is flat.     Tenderness: There is no abdominal tenderness.  Skin:    Capillary Refill: Capillary refill takes less than 2 seconds.  Neurological:     Mental Status: She is alert and oriented to person, place, and time.  Psychiatric:        Mood and Affect:  Mood normal.        Behavior: Behavior normal.        Thought Content: Thought content normal.        Judgment: Judgment normal.     MAU Course  Procedures  MDM Orders Placed This Encounter  Procedures   Wet prep, genital   US OB Comp Less 14 Wks   Urinalysis, Routine w reflex microscopic Urine, Clean Catch   CBC   hCG, quantitative, pregnancy   Pregnancy, urine POC   Rh IG workup (includes ABO/Rh)   Patient Vitals for the past 24 hrs:  BP Temp Temp src Pulse Resp SpO2 Height Weight  03/12/22 1814 118/60 -- -- 82 16 -- -- --  03/12/22 1536 (!) 111/58 98.2 F (36.8 C) Oral 81 17 100 % 5\' 4"  (1.626 m) 63.5 kg   Results for orders placed or performed during the hospital encounter of 03/12/22 (from the past 24 hour(s))  Wet prep, genital     Status: Abnormal   Collection Time: 03/12/22  3:35 PM   Specimen: PATH Cytology Cervicovaginal Ancillary Only  Result Value Ref Range   Yeast  Wet Prep HPF POC NONE SEEN NONE SEEN   Trich, Wet Prep NONE SEEN NONE SEEN   Clue Cells Wet Prep HPF POC NONE SEEN NONE SEEN   WBC, Wet Prep HPF POC >=10 (A) <10   Sperm NONE SEEN   Pregnancy, urine POC     Status: Abnormal   Collection Time: 03/12/22  3:35 PM  Result Value Ref Range   Preg Test, Ur POSITIVE (A) NEGATIVE  CBC     Status: None   Collection Time: 03/12/22  4:21 PM  Result Value Ref Range   WBC 7.5 4.0 - 10.5 K/uL   RBC 3.90 3.87 - 5.11 MIL/uL   Hemoglobin 12.6 12.0 - 15.0 g/dL   HCT 03/14/22 67.2 - 09.4 %   MCV 95.1 80.0 - 100.0 fL   MCH 32.3 26.0 - 34.0 pg   MCHC 34.0 30.0 - 36.0 g/dL   RDW 70.9 62.8 - 36.6 %   Platelets 250 150 - 400 K/uL   nRBC 0.0 0.0 - 0.2 %  Rh IG workup (includes ABO/Rh)     Status: None (Preliminary result)   Collection Time: 03/12/22  4:22 PM  Result Value Ref Range   Gestational Age(Wks) 6    ABO/RH(D) O NEG    Antibody Screen NEG    Unit Number 03/14/22    Blood Component Type RHIG    Unit division 00    Status of Unit ISSUED    Transfusion Status      OK TO TRANSFUSE Performed at [facility], [address] [place], [place]    [address] [place] Comp Less 14 Wks  Result Date: 03/12/2022 CLINICAL DATA:  Pregnant patient with spotting. EXAM: OBSTETRIC <14 WK ULTRASOUND TECHNIQUE: Transabdominal ultrasound was performed for evaluation of the gestation as well as the maternal uterus and adnexal regions. COMPARISON:  None Available. FINDINGS: Intrauterine gestational sac: Single Yolk sac:  Yes Embryo:  Yes Cardiac Activity: Yes Heart Rate: 189 bpm CRL:   20.1 mm   8 w 4 d                  03/14/2022 EDC: October 18, 2022 Subchorionic hemorrhage: Suspected small anterior subchorionic hemorrhage. Maternal uterus/adnexae: The right ovary was not visualized. A corpus luteum cyst is identified the left ovary. IMPRESSION: 1. Single live IUP. 2.  Question small anterior subchorionic hemorrhage. 3. No other abnormalities. Electronically Signed   By:  Gerome Sam III M.D.   On: 03/12/2022 17:32    Meds ordered this encounter  Medications   rho (d) immune globulin (RHIG/RHOPHYLAC) injection 300 mcg   Prenatal Vit-Fe Fumarate-FA (PREPLUS) 27-1 MG TABS    Sig: Take 1 tablet by mouth daily.    Dispense:  30 tablet    Refill:  13    Order Specific Question:   Supervising Provider    Answer:   CONSTANT, PEGGY [[id number]]   Assessment and Plan  --26 y.o. P7T0626 with SIUP measuring [redacted]w[redacted]d  --Rh Neg, s/p Rhogam administration --Subchorionic hematoma, pelvic rest advised --Hgb 12.6 --Discharge home in stable condition with first trimester precautions  F/U: --Patient to select prenatal care provider, schedule care  Calvert Cantor, MSA, MSN, CNM 03/12/2022, 6:41 PM

## 2022-03-12 NOTE — MAU Note (Signed)
Janet Rivers is a 26 y.o. at Unknown here in MAU reporting: +HPT last night.  Has been spotting for almost 2 wks, thought it was her period.  She is [place], so that is why she came here. Had some back pain a couple days ago, after Christmas shopping all day. LMP: 11/5 Onset of complaint: 2wks ago Pain score: none Vitals:   03/12/22 1536  BP: (!) 111/58  Pulse: 81  Resp: 17  Temp: 98.2 F (36.8 C)  SpO2: 100%      Lab orders placed from triage:  UPT/UA, vag swabs

## 2022-03-13 LAB — RH IG WORKUP (INCLUDES ABO/RH)
ABO/RH(D): O NEG
Antibody Screen: NEGATIVE
Gestational Age(Wks): 6
Unit division: 0

## 2022-03-14 LAB — GC/CHLAMYDIA PROBE AMP (~~LOC~~) NOT AT ARMC
Chlamydia: NEGATIVE
Comment: NEGATIVE
Comment: NORMAL
Neisseria Gonorrhea: NEGATIVE

## 2022-04-25 ENCOUNTER — Telehealth (INDEPENDENT_AMBULATORY_CARE_PROVIDER_SITE_OTHER): Payer: Medicaid Other

## 2022-04-25 DIAGNOSIS — Z3689 Encounter for other specified antenatal screening: Secondary | ICD-10-CM

## 2022-04-25 DIAGNOSIS — Z3480 Encounter for supervision of other normal pregnancy, unspecified trimester: Secondary | ICD-10-CM | POA: Insufficient documentation

## 2022-04-25 MED ORDER — GOJJI WEIGHT SCALE MISC: 1.0000 | 0 refills | Status: DC | PRN

## 2022-04-25 MED ORDER — BLOOD PRESSURE KIT DEVI: 1.0000 | 0 refills | Status: DC | PRN

## 2022-04-25 NOTE — Progress Notes (Signed)
New OB Intake  I connected with Janet Rivers  on 04/25/22 at  8:15 AM EST by MyChart Video Visit and verified that I am speaking with the correct person using two identifiers. Nurse is located at [facility] and pt is located at home.  I discussed the limitations, risks, security and privacy concerns of performing an evaluation and management service by telephone and the availability of in person appointments. I also discussed with the patient that there may be a patient responsible charge related to this service. The patient expressed understanding and agreed to proceed.  I explained I am completing New OB Intake today. We discussed EDD of 10/18/2022 that is based on ultrasound of 03/12/2022. Pt is G4/P2. I reviewed her allergies, medications, Medical/Surgical/OB history, and appropriate screenings. I informed her of [facility] services. [facility] information placed in AVS. Based on history, this is a low risk pregnancy.  Patient Active Problem List   Diagnosis Date Noted   Supervision of other normal pregnancy, antepartum 04/25/2022   Rubella non-immune status, antepartum 02/21/2020   History of anesthesia reaction 01/29/2020   Rh negative state in antepartum period, first trimester 12/15/2019   History of genital warts 10/06/2019      Concerns addressed today -1st C-Section in pregnancy due to genital warts and 2nd pregnancy, she wanted to have another repeat C-Section.  -Horizon on 058/07/2019 Negative  -O NEG -Pap done on 08/05/82021 Negative  Delivery Plans Plans to deliver at [facility]. Patient given information for [facility] Healthy Baby website for more information about [facility]. Patient is not interested in water birth. Offered upcoming OB visit with CNM to discuss further.  MyChart/Babyscripts MyChart access verified. I explained pt will have some visits in office and some virtually. Babyscripts instructions given and order placed. Patient verifies receipt of  registration text/e-mail. Account successfully created and app downloaded.  Blood Pressure Cuff/Weight Scale Blood pressure cuff ordered for patient to pick-up from [employer]. Explained after first prenatal appt pt will check weekly and document in 31. Patient does not have weight scale; order sent to [facility], patient may track weight weekly in Babyscripts.  Anatomy US Explained first scheduled Korea will be around 19 weeks. Anatomy US scheduled for 05/31/2022 at 2:45 PM. Pt notified to arrive at 2:30 PM.  Labs Discussed Johnsie Cancel genetic screening with patient. Would like Panorama  COVID Vaccine Patient has had COVID vaccine.   Is patient a CenteringPregnancy candidate?  Declined Declined due to Schedule    Is patient a Mom+Baby Combined Care candidate?  Not a candidate   If accepted, Mom+Baby staff notified  Social Determinants of Health Food Insecurity: Patient denies food insecurity. WIC Referral: Patient is interested in referral to [facility].  Transportation: Patient denies transportation needs. Childcare: Discussed no children allowed at ultrasound appointments. Offered childcare services; patient declines childcare services at this time.  Interested in [place]? If yes, send referral.   First visit review I reviewed new OB appt with patient. I explained they will have a provider visit that includes initial OB labs, panorama, AFP, OB Culture, GC/CH, and Hemoglobin A1C. Explained pt will be seen by Woodroe Mode MD at first visit; encounter routed to appropriate provider. Explained that patient will be seen by pregnancy navigator following visit with provider.   Janet Rivers, [place] 04/25/2022  8:17 AM

## 2022-05-02 ENCOUNTER — Other Ambulatory Visit (HOSPITAL_COMMUNITY)
Admission: RE | Admit: 2022-05-02 | Discharge: 2022-05-02 | Disposition: A | Discharge status: Home / Self Care | Payer: Medicaid Other | Source: Ambulatory Visit | Attending: Obstetrics & Gynecology | Admitting: Obstetrics & Gynecology

## 2022-05-02 ENCOUNTER — Other Ambulatory Visit: Payer: Self-pay

## 2022-05-02 ENCOUNTER — Encounter: Payer: Self-pay | Admitting: Obstetrics & Gynecology

## 2022-05-02 ENCOUNTER — Ambulatory Visit (INDEPENDENT_AMBULATORY_CARE_PROVIDER_SITE_OTHER): Payer: Medicaid Other | Admitting: Obstetrics & Gynecology

## 2022-05-02 VITALS — BP 109/65 | HR 89 | Wt 140.0 lb

## 2022-05-02 DIAGNOSIS — Z3482 Encounter for supervision of other normal pregnancy, second trimester: Secondary | ICD-10-CM | POA: Diagnosis not present

## 2022-05-02 DIAGNOSIS — Z87898 Personal history of other specified conditions: Secondary | ICD-10-CM | POA: Diagnosis not present

## 2022-05-02 DIAGNOSIS — Z3A15 15 weeks gestation of pregnancy: Secondary | ICD-10-CM

## 2022-05-02 DIAGNOSIS — Z3480 Encounter for supervision of other normal pregnancy, unspecified trimester: Secondary | ICD-10-CM

## 2022-05-02 NOTE — Progress Notes (Signed)
  Subjective: mild HA resolves with Tylenol    Janet Rivers is a [id number] [redacted]w[redacted]d being seen today for her first obstetrical visit.  Her obstetrical history is significant for  2 prior cesarean sections . Patient does intend to breast feed. Pregnancy history fully reviewed.  Patient reports headache.  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   Family History  Problem Relation Age of Onset   Healthy Father    Healthy Mother      Exam    Uterus:  Fundal Height: 16 cm  Pelvic Exam: deferred                              System: Breast:  normal appearance, no masses or tenderness   Skin: normal coloration and turgor, no rashes    Neurologic: oriented, normal mood   Extremities: normal strength, tone, and muscle mass   HEENT PERRLA  and neck supple with midline trachea   Mouth/Teeth mucous membranes moist, pharynx normal without lesions and dental hygiene good   Neck supple   Cardiovascular: regular rate and rhythm   Respiratory:  appears well, vitals normal, no respiratory distress, acyanotic, normal RR   Abdomen: soft, non-tender; bowel sounds normal; no masses,  no organomegaly   Urinary:       Assessment:    Pregnancy: [id number] Patient Active Problem List   Diagnosis Date Noted   Supervision of other normal pregnancy, antepartum 04/25/2022   Rubella non-immune status, antepartum 02/21/2020   History of anesthesia reaction 01/29/2020   Rh negative state in antepartum period, first trimester 12/15/2019   History of genital warts 10/06/2019        Plan:     Initial labs drawn. Prenatal vitamins. Problem list reviewed and updated. Genetic Screening discussed : ordered.  Ultrasound discussed; fetal survey: ordered.  Follow up in 4 weeks. 50% of 30 min visit spent on counseling and coordination of care.  Considering TOLAC   Emeterio Reeve 05/02/2022

## 2022-05-03 LAB — GC/CHLAMYDIA PROBE AMP (~~LOC~~) NOT AT ARMC
Chlamydia: NEGATIVE
Comment: NEGATIVE
Comment: NORMAL
Neisseria Gonorrhea: NEGATIVE

## 2022-05-04 LAB — URINE CULTURE, OB REFLEX

## 2022-05-04 LAB — CULTURE, OB URINE

## 2022-05-08 LAB — CBC/D/PLT+RPR+RH+ABO+RUBIGG...
Basophils Absolute: 0 10*3/uL (ref 0.0–0.2)
Basos: 0 %
EOS (ABSOLUTE): 0 10*3/uL (ref 0.0–0.4)
Eos: 0 %
HCV Ab: NONREACTIVE
HIV Screen 4th Generation wRfx: NONREACTIVE
Hematocrit: 31.4 % — ABNORMAL LOW (ref 34.0–46.6)
Hemoglobin: 10.7 g/dL — ABNORMAL LOW (ref 11.1–15.9)
Hepatitis B Surface Ag: NEGATIVE
Immature Grans (Abs): 0.1 10*3/uL (ref 0.0–0.1)
Immature Granulocytes: 1 %
Lymphocytes Absolute: 2 10*3/uL (ref 0.7–3.1)
Lymphs: 31 %
MCH: 31.8 pg (ref 26.6–33.0)
MCHC: 34.1 g/dL (ref 31.5–35.7)
MCV: 93 fL (ref 79–97)
Monocytes Absolute: 0.3 10*3/uL (ref 0.1–0.9)
Monocytes: 5 %
Neutrophils Absolute: 4.2 10*3/uL (ref 1.4–7.0)
Neutrophils: 63 %
Platelets: 269 10*3/uL (ref 150–450)
RBC: 3.37 x10E6/uL — ABNORMAL LOW (ref 3.77–5.28)
RDW: 11.9 % (ref 11.7–15.4)
RPR Ser Ql: NONREACTIVE
Rh Factor: NEGATIVE
Rubella Antibodies, IGG: 1.46 index (ref >0.99)
WBC: 6.6 10*3/uL (ref 3.4–10.8)

## 2022-05-08 LAB — AB SCR+ANTIBODY ID: Antibody Screen: POSITIVE — AB

## 2022-05-08 LAB — PANORAMA PRENATAL TEST FULL PANEL:PANORAMA TEST PLUS 5 ADDITIONAL MICRODELETIONS: FETAL FRACTION: 4.7

## 2022-05-08 LAB — AFP, SERUM, OPEN SPINA BIFIDA
AFP MoM: 1
AFP Value: 31 ng/mL
Gest. Age on Collection Date: 15 weeks
Maternal Age At EDD: 27.7 yr
OSBR Risk 1 IN: 10000
Test Results:: NEGATIVE
Weight: 140 [lb_av]

## 2022-05-08 LAB — HEMOGLOBIN A1C
Est. average glucose Bld gHb Est-mCnc: 103 mg/dL
Hgb A1c MFr Bld: 5.2 % (ref 4.8–5.6)

## 2022-05-08 LAB — HCV INTERPRETATION

## 2022-05-13 LAB — HORIZON CUSTOM: REPORT SUMMARY: NEGATIVE

## 2022-05-27 NOTE — Progress Notes (Unsigned)
  Maternal Fetal Medicine Patient Intake Form Patient information  Patient Name: Janet Rivers  Patient MRN:   [id number]  Referring practice: MFM Referring Provider: [facility])  Medical/Obstetric History   Past pregnancies OB History  Gravida Para Term Preterm AB Living  4 2 2  0 1 2  SAB IAB Ectopic Multiple Live Births  1 0 0 0 2    # Outcome Date GA Lbr Len/2nd Weight Sex Delivery Anes PTL Lv  4 Current           3 Term 02/19/20 [redacted]w[redacted]d  5 lb 13.7 oz (2.655 kg) M CS-LTranv Spinal  LIV  2 SAB 05/2019          1 Term 07/29/11 [redacted]w[redacted]d  6 lb 13 oz (3.09 kg) F CS-Unspec  N LIV     Complications: Genital warts complicating pregnancy     Dating method LMP: Patient's last menstrual period was 01/20/2022.  {LMP:19197::"Exact","Unknown","Approximate"}. Menstrual cycles: {Menstrual cycle:19197::"Regular every 28 days","Irregular every *** days","Unsure"}. EDD: Estimated Date of Delivery: 10/18/22 based on Early [place] at [redacted]w[redacted]d weeks gestation done on 03/12/22 .   Ultrasound indications Ultrasound Indication: Anatomy  Pre-gravid BMI: 22      Prenatal Labs   Type/screen NIPT AFP Carrier/Horizon  Rh status: O/Negative/-- (02/15 1015) Rh-  Ab screen:  Positive, See Final Results (02/15 1015)  positive: anti-D antibody too week to titer (Rhoph 12/26) Low risk  Fetal sex:Female Negative Normal      Other Labs   Glucose Other  1/2h GCT:  {Glucose screen:19197::"Pass","Fail","TBD at 28w"}  3h GTT:  {Glucose test:19196::"Pass","Fail","1h: ***","2h:***","3h:***","Pending","n/a"} ***, A1c:5.2*, QUAD:***, Amnio: ***, CVS:***, Fasting blood glucose: ***, 2h PP glucse: ***, and None    Imaging (Date and Location)   Maternal echo: {Maternal echo:19197::"normal","abnormal, see report","n/a"} EKG: {Maternal echo:19197::"normal","abnormal, see results","n/a"} Fetal echo: {Maternal echo:19197::"normal","abnormal, see report","n/a"} Last fetal growth:   -Estimated fetal weight: ______ percentile -Abdominal circumference: ______ percentile -Dopplers: [ ]  Normal [ ]  Elevated [ ]  Other: ____________  -Other: ____________     Allergies  No Known Allergies   Medications   Current Outpatient Medications on File Prior to Visit  Medication Sig Dispense Refill   Acetaminophen (TYLENOL 8 HOUR PO) Take by mouth.     Blood Pressure Monitoring (BLOOD PRESSURE KIT) DEVI 1 Device by Does not apply route as needed. 1 each 0   Misc. Devices (GOJJI WEIGHT SCALE) MISC 1 Device by Does not apply route as needed. 1 each 0   Prenatal Vit-Fe Fumarate-FA (PREPLUS) 27-1 MG TABS Take 1 tablet by mouth daily. 30 tablet 13   No current facility-administered medications on file prior to visit.     Prenatal flow sheet   Date GA Fetal Movement Contractions Vaginal bleeding Loss of fluid Vital signs   05/27/2022  [redacted]w[redacted]d {Blank single:19197::"***","DECREASED","Normal"} {Blank single:19197::"***","YES","Braxton-Hicks","No"} {Blank single:19197::"***","SPOTTING","BLEEDING","Normal"} {Blank single:19197::"***","YES","No"} There were no vitals filed for this visit. There is no height or weight on file to calculate BMI.    Other Patient Concerns   Other patient concerns: ***   Future Appointments   Future Appointments  Date Time Provider Peak  05/30/2022 10:15 AM Aletha Halim, MD [facility]  05/31/2022  2:45 PM [facility] US4 [facility]    Baruch Gouty, RN

## 2022-05-30 ENCOUNTER — Encounter: Payer: Medicaid Other | Admitting: Advanced Practice Midwife

## 2022-05-31 ENCOUNTER — Ambulatory Visit: Payer: Medicaid Other | Attending: Obstetrics & Gynecology

## 2022-05-31 DIAGNOSIS — Z363 Encounter for antenatal screening for malformations: Secondary | ICD-10-CM | POA: Insufficient documentation

## 2022-05-31 DIAGNOSIS — Z3480 Encounter for supervision of other normal pregnancy, unspecified trimester: Secondary | ICD-10-CM | POA: Diagnosis not present

## 2022-05-31 DIAGNOSIS — Z3A20 20 weeks gestation of pregnancy: Secondary | ICD-10-CM | POA: Insufficient documentation

## 2022-06-18 ENCOUNTER — Encounter: Payer: Medicaid Other | Admitting: Family Medicine

## 2022-06-20 NOTE — Progress Notes (Signed)
Alert received on 06/19/2022 through NCNotify system as follows: Patient identified as not having a recommended prenatal visit. Patient is 22 weeks and 5 days pregnant. Most recent prenatal visit was on 05/02/2022 at [place].  Chart review: Patient's last visit was on 05/02/2022.  No action needed. Patient has upcoming appointment 07/09/2022.  Caryl Ada, CMA 06/20/2022  8:44 AM

## 2022-07-09 ENCOUNTER — Ambulatory Visit (INDEPENDENT_AMBULATORY_CARE_PROVIDER_SITE_OTHER): Payer: Medicaid Other | Admitting: Obstetrics and Gynecology

## 2022-07-09 ENCOUNTER — Other Ambulatory Visit (HOSPITAL_COMMUNITY)
Admission: RE | Admit: 2022-07-09 | Discharge: 2022-07-09 | Disposition: A | Discharge status: Home / Self Care | Payer: Medicaid Other | Source: Ambulatory Visit | Attending: Obstetrics and Gynecology | Admitting: Obstetrics and Gynecology

## 2022-07-09 ENCOUNTER — Other Ambulatory Visit: Payer: Self-pay

## 2022-07-09 VITALS — BP 106/56 | HR 86 | Wt 143.0 lb

## 2022-07-09 DIAGNOSIS — O26891 Other specified pregnancy related conditions, first trimester: Secondary | ICD-10-CM

## 2022-07-09 DIAGNOSIS — Z3A25 25 weeks gestation of pregnancy: Secondary | ICD-10-CM

## 2022-07-09 DIAGNOSIS — N898 Other specified noninflammatory disorders of vagina: Secondary | ICD-10-CM | POA: Insufficient documentation

## 2022-07-09 DIAGNOSIS — Z3480 Encounter for supervision of other normal pregnancy, unspecified trimester: Secondary | ICD-10-CM

## 2022-07-09 DIAGNOSIS — O26892 Other specified pregnancy related conditions, second trimester: Secondary | ICD-10-CM

## 2022-07-09 DIAGNOSIS — Z6791 Unspecified blood type, Rh negative: Secondary | ICD-10-CM

## 2022-07-09 NOTE — Progress Notes (Signed)
   PRENATAL VISIT NOTE  Subjective:  Janet Rivers is a 27 y.o. Z6X0960 at [redacted]w[redacted]d being seen today for ongoing prenatal care.  She is currently monitored for the following issues for this low-risk pregnancy and has History of genital warts; Rh negative state in antepartum period, first trimester; History of anesthesia reaction; Rubella non-immune status, antepartum; and Supervision of other normal pregnancy, antepartum on their problem list.  Patient reports  white vaginal discharge and odor .  Contractions: Not present. Vag. Bleeding: None.  Movement: Present. Denies leaking of fluid.   The following portions of the patient's history were reviewed and updated as appropriate: allergies, current medications, past family history, past medical history, past social history, past surgical history and problem list.   Objective:   Vitals:   07/09/22 0945  BP: (!) 106/56  Pulse: 86  Weight: 143 lb (64.9 kg)    Fetal Status: Fetal Heart Rate (bpm): 150   Movement: Present     General:  Alert, oriented and cooperative. Patient is in no acute distress.  Skin: Skin is warm and dry. No rash noted.   Cardiovascular: Normal heart rate noted  Respiratory: Normal respiratory effort, no problems with respiration noted  Abdomen: Soft, gravid, appropriate for gestational age.  Pain/Pressure: Absent     Pelvic: Cervical exam deferred        Extremities: Normal range of motion.  Edema: None  Mental Status: Normal mood and affect. Normal behavior. Normal judgment and thought content.   Assessment and Plan:  Pregnancy: A5W0981 at [redacted]w[redacted]d 1. Supervision of other normal pregnancy, antepartum FHR and FH wnl  2. [redacted] weeks gestation of pregnancy   3. Rh negative state in antepartum period, first trimester Given 12/23 in MAU Due next visit  4. Vaginal discharge Self swab collected - Cervicovaginal ancillary only   Preterm labor symptoms and general obstetric precautions including but not  limited to vaginal bleeding, contractions, leaking of fluid and fetal movement were reviewed in detail with the patient. Please refer to After Visit Summary for other counseling recommendations.   Return for OFFICE OB VISIT (MD or APP).  Future Appointments  Date Time Provider Department Center  07/30/2022  8:20 AM [facility]  07/30/2022  8:55 AM Crissie Reese, Mary Sella, MD [facility]    Lorriane Shire, MD

## 2022-07-10 LAB — CERVICOVAGINAL ANCILLARY ONLY
Bacterial Vaginitis (gardnerella): NEGATIVE
Candida Glabrata: NEGATIVE
Candida Vaginitis: POSITIVE — AB
Chlamydia: NEGATIVE
Comment: NEGATIVE
Comment: NEGATIVE
Comment: NEGATIVE
Comment: NEGATIVE
Comment: NEGATIVE
Comment: NORMAL
Neisseria Gonorrhea: NEGATIVE
Trichomonas: NEGATIVE

## 2022-07-16 ENCOUNTER — Other Ambulatory Visit: Payer: Self-pay | Admitting: Obstetrics and Gynecology

## 2022-07-16 DIAGNOSIS — B3731 Acute candidiasis of vulva and vagina: Secondary | ICD-10-CM

## 2022-07-16 MED ORDER — TERCONAZOLE 0.8 % VA CREA: 1.0000 | TOPICAL_CREAM | Freq: Every day | VAGINAL | 0 refills | Status: DC

## 2022-07-18 ENCOUNTER — Telehealth: Payer: Self-pay | Admitting: Lactation Services

## 2022-07-18 NOTE — Telephone Encounter (Signed)
Per chart review, patient has not read most recent message about + yeast and treatment sent to Pharmacy. Patient did not answer. LM for patient to check her My Chart message and pick up prescription from Pharmacy.

## 2022-07-29 ENCOUNTER — Other Ambulatory Visit: Payer: Self-pay | Admitting: Lactation Services

## 2022-07-29 DIAGNOSIS — O26891 Other specified pregnancy related conditions, first trimester: Secondary | ICD-10-CM

## 2022-07-29 DIAGNOSIS — Z3480 Encounter for supervision of other normal pregnancy, unspecified trimester: Secondary | ICD-10-CM

## 2022-07-30 ENCOUNTER — Encounter: Payer: Self-pay | Admitting: Family Medicine

## 2022-07-30 ENCOUNTER — Ambulatory Visit (INDEPENDENT_AMBULATORY_CARE_PROVIDER_SITE_OTHER): Payer: Medicaid Other | Admitting: Family Medicine

## 2022-07-30 ENCOUNTER — Other Ambulatory Visit: Payer: Medicaid Other

## 2022-07-30 ENCOUNTER — Other Ambulatory Visit: Payer: Self-pay

## 2022-07-30 VITALS — BP 102/67 | HR 90 | Wt 152.0 lb

## 2022-07-30 DIAGNOSIS — Z3480 Encounter for supervision of other normal pregnancy, unspecified trimester: Secondary | ICD-10-CM

## 2022-07-30 DIAGNOSIS — Z6791 Unspecified blood type, Rh negative: Secondary | ICD-10-CM

## 2022-07-30 DIAGNOSIS — Z98891 History of uterine scar from previous surgery: Secondary | ICD-10-CM

## 2022-07-30 DIAGNOSIS — O09893 Supervision of other high risk pregnancies, third trimester: Secondary | ICD-10-CM

## 2022-07-30 DIAGNOSIS — O26891 Other specified pregnancy related conditions, first trimester: Secondary | ICD-10-CM

## 2022-07-30 DIAGNOSIS — Z3182 Encounter for Rh incompatibility status: Secondary | ICD-10-CM

## 2022-07-30 DIAGNOSIS — Z87898 Personal history of other specified conditions: Secondary | ICD-10-CM

## 2022-07-30 DIAGNOSIS — O09899 Supervision of other high risk pregnancies, unspecified trimester: Secondary | ICD-10-CM

## 2022-07-30 DIAGNOSIS — Z2839 Other underimmunization status: Secondary | ICD-10-CM

## 2022-07-30 DIAGNOSIS — Z3A28 28 weeks gestation of pregnancy: Secondary | ICD-10-CM | POA: Diagnosis not present

## 2022-07-30 DIAGNOSIS — Z23 Encounter for immunization: Secondary | ICD-10-CM | POA: Diagnosis not present

## 2022-07-30 MED ORDER — RHO D IMMUNE GLOBULIN 1500 UNIT/2ML IJ SOSY
300.0000 ug | PREFILLED_SYRINGE | Freq: Once | INTRAMUSCULAR | Status: AC
  Administered 2022-07-30: 300 ug via INTRAMUSCULAR

## 2022-07-30 NOTE — Progress Notes (Signed)
   Subjective:  Janet Rivers is a 27 y.o. Z6X0960 at [redacted]w[redacted]d being seen today for ongoing prenatal care.  She is currently monitored for the following issues for this low-risk pregnancy and has History of genital warts; Rh negative state in antepartum period, first trimester; History of anesthesia reaction; Rubella non-immune status, antepartum; and Supervision of other normal pregnancy, antepartum on their problem list.  Patient reports no complaints.   . Vag. Bleeding: None.  Movement: Present. Denies leaking of fluid.   The following portions of the patient's history were reviewed and updated as appropriate: allergies, current medications, past family history, past medical history, past social history, past surgical history and problem list. Problem list updated.  Objective:   Vitals:   07/30/22 0852  BP: 102/67  Pulse: 90  Weight: 152 lb (68.9 kg)    Fetal Status: Fetal Heart Rate (bpm): 139   Movement: Present     General:  Alert, oriented and cooperative. Patient is in no acute distress.  Skin: Skin is warm and dry. No rash noted.   Cardiovascular: Normal heart rate noted  Respiratory: Normal respiratory effort, no problems with respiration noted  Abdomen: Soft, gravid, appropriate for gestational age. Pain/Pressure: Absent     Pelvic: Vag. Bleeding: None Vag D/C Character: White   Cervical exam deferred        Extremities: Normal range of motion.  Edema: None  Mental Status: Normal mood and affect. Normal behavior. Normal judgment and thought content.   Urinalysis:      Assessment and Plan:  Pregnancy: A5W0981 at [redacted]w[redacted]d  1. Supervision of other normal pregnancy, antepartum BP and FHR normal Rhogam and TDAP given today 28 week labs obtained today Discussed contraception, thinking about BTL but still undecided, signed consent today and will continue to consider her options  2. Rh negative state in antepartum period, first trimester Rhogam given  3. Rubella  non-immune status, antepartum Offer MMR PP  4. History of anesthesia reaction ?cardiac arrest? During general anesthesia several years ago, no issues with cesarean in 02/2020  5. History of cesarean delivery Message sent to schedule at 39 weeks  Preterm labor symptoms and general obstetric precautions including but not limited to vaginal bleeding, contractions, leaking of fluid and fetal movement were reviewed in detail with the patient. Please refer to After Visit Summary for other counseling recommendations.  Return in 2 weeks (on 08/13/2022) for [facility], ob visit.   Venora Maples, MD

## 2022-07-30 NOTE — Addendum Note (Signed)
Addended by: Coolidge Breeze on: 07/30/2022 10:40 AM   Modules accepted: Orders

## 2022-07-30 NOTE — Patient Instructions (Signed)

## 2022-07-31 LAB — RPR: RPR Ser Ql: NONREACTIVE

## 2022-07-31 LAB — GLUCOSE TOLERANCE, 2 HOURS W/ 1HR
Glucose, 1 hour: 92 mg/dL (ref 70–179)
Glucose, 2 hour: 67 mg/dL — ABNORMAL LOW (ref 70–152)
Glucose, Fasting: 74 mg/dL (ref 70–91)

## 2022-07-31 LAB — CBC
Hematocrit: 33 % — ABNORMAL LOW (ref 34.0–46.6)
Hemoglobin: 11.3 g/dL (ref 11.1–15.9)
MCH: 32.7 pg (ref 26.6–33.0)
MCHC: 34.2 g/dL (ref 31.5–35.7)
MCV: 95 fL (ref 79–97)
Platelets: 192 10*3/uL (ref 150–450)
RBC: 3.46 x10E6/uL — ABNORMAL LOW (ref 3.77–5.28)
RDW: 12.3 % (ref 11.7–15.4)
WBC: 6.9 10*3/uL (ref 3.4–10.8)

## 2022-07-31 LAB — HIV ANTIBODY (ROUTINE TESTING W REFLEX): HIV Screen 4th Generation wRfx: NONREACTIVE

## 2022-07-31 LAB — ANTIBODY SCREEN: Antibody Screen: NEGATIVE

## 2022-08-01 ENCOUNTER — Encounter: Payer: Self-pay | Admitting: *Deleted

## 2022-08-14 ENCOUNTER — Ambulatory Visit (INDEPENDENT_AMBULATORY_CARE_PROVIDER_SITE_OTHER): Payer: Medicaid Other | Admitting: Student

## 2022-08-14 ENCOUNTER — Other Ambulatory Visit: Payer: Self-pay

## 2022-08-14 VITALS — BP 101/68 | HR 91 | Wt 152.8 lb

## 2022-08-14 DIAGNOSIS — O09893 Supervision of other high risk pregnancies, third trimester: Secondary | ICD-10-CM

## 2022-08-14 DIAGNOSIS — Z98891 History of uterine scar from previous surgery: Secondary | ICD-10-CM

## 2022-08-14 DIAGNOSIS — O09899 Supervision of other high risk pregnancies, unspecified trimester: Secondary | ICD-10-CM

## 2022-08-14 DIAGNOSIS — Z3A30 30 weeks gestation of pregnancy: Secondary | ICD-10-CM

## 2022-08-14 DIAGNOSIS — Z2839 Other underimmunization status: Secondary | ICD-10-CM

## 2022-08-14 DIAGNOSIS — O26891 Other specified pregnancy related conditions, first trimester: Secondary | ICD-10-CM

## 2022-08-14 DIAGNOSIS — Z87898 Personal history of other specified conditions: Secondary | ICD-10-CM

## 2022-08-14 DIAGNOSIS — O26893 Other specified pregnancy related conditions, third trimester: Secondary | ICD-10-CM

## 2022-08-14 DIAGNOSIS — Z6791 Unspecified blood type, Rh negative: Secondary | ICD-10-CM

## 2022-08-14 DIAGNOSIS — Z3480 Encounter for supervision of other normal pregnancy, unspecified trimester: Secondary | ICD-10-CM

## 2022-08-14 NOTE — Progress Notes (Signed)
   PRENATAL VISIT NOTE  Subjective:  Janet Rivers is a 27 y.o. [id number] at [redacted]w[redacted]d being seen today for ongoing prenatal care.  She is currently monitored for the following issues for this low-risk pregnancy and has History of genital warts; Rh negative state in antepartum period, first trimester; History of anesthesia reaction; History of cesarean delivery; Rubella non-immune status, antepartum; and Supervision of other normal pregnancy, antepartum on their problem list.  Patient reports no complaints.   . Vag. Bleeding: None.  Movement: Present. Denies leaking of fluid.   The following portions of the patient's history were reviewed and updated as appropriate: allergies, current medications, past family history, past medical history, past social history, past surgical history and problem list.   Objective:   Vitals:   08/14/22 1338  BP: 101/68  Pulse: 91  Weight: 152 lb 12.8 oz (69.3 kg)    Fetal Status: Fetal Heart Rate (bpm): 140   Movement: Present     General:  Alert, oriented and cooperative. Patient is in no acute distress.  Skin: Skin is warm and dry. No rash noted.   Cardiovascular: Normal heart rate noted  Respiratory: Normal respiratory effort, no problems with respiration noted  Abdomen: Soft, gravid, appropriate for gestational age.  Pain/Pressure: Absent     Pelvic: Cervical exam deferred        Extremities: Normal range of motion.  Edema: None  Mental Status: Normal mood and affect. Normal behavior. Normal judgment and thought content.   Assessment and Plan:  Pregnancy: [id number] at [redacted]w[redacted]d 1. Supervision of other normal pregnancy, antepartum - frequent fetal movement   2. [redacted] weeks gestation of pregnancy - continue routine prenatal care  3. Rh negative state in antepartum period, first trimester - Received rhogam in pregnancy   4. Rubella non-immune status, antepartum - Needs MMR PP  5. History of cesarean delivery - desires repeat at 39 weeks  6.  History of anesthesia reaction - Several years ago, did not have issues with cesarean in 2021  Preterm labor symptoms and general obstetric precautions including but not limited to vaginal bleeding, contractions, leaking of fluid and fetal movement were reviewed in detail with the patient. Please refer to After Visit Summary for other counseling recommendations.   Return in about 2 weeks (around 08/28/2022) for IN-PERSON, VIRTUAL, LOB.  No future appointments.   Corlis Hove, NP

## 2022-08-28 ENCOUNTER — Other Ambulatory Visit: Payer: Self-pay

## 2022-08-28 ENCOUNTER — Ambulatory Visit (INDEPENDENT_AMBULATORY_CARE_PROVIDER_SITE_OTHER): Payer: Medicaid Other | Admitting: Obstetrics and Gynecology

## 2022-08-28 VITALS — BP 107/71 | HR 104 | Wt 153.7 lb

## 2022-08-28 DIAGNOSIS — O26893 Other specified pregnancy related conditions, third trimester: Secondary | ICD-10-CM

## 2022-08-28 DIAGNOSIS — Z6791 Unspecified blood type, Rh negative: Secondary | ICD-10-CM

## 2022-08-28 DIAGNOSIS — O09893 Supervision of other high risk pregnancies, third trimester: Secondary | ICD-10-CM

## 2022-08-28 DIAGNOSIS — Z2839 Other underimmunization status: Secondary | ICD-10-CM

## 2022-08-28 DIAGNOSIS — Z3480 Encounter for supervision of other normal pregnancy, unspecified trimester: Secondary | ICD-10-CM

## 2022-08-28 DIAGNOSIS — Z98891 History of uterine scar from previous surgery: Secondary | ICD-10-CM

## 2022-08-28 DIAGNOSIS — M25473 Effusion, unspecified ankle: Secondary | ICD-10-CM

## 2022-08-28 DIAGNOSIS — Z3A32 32 weeks gestation of pregnancy: Secondary | ICD-10-CM

## 2022-08-28 NOTE — Progress Notes (Signed)
   PRENATAL VISIT NOTE  Subjective:  Janet Rivers is a 27 y.o. [id number] at [redacted]w[redacted]d being seen today for ongoing prenatal care.  She is currently monitored for the following issues for this low-risk pregnancy and has History of genital warts; Rh negative state in antepartum period, first trimester; History of anesthesia reaction; History of cesarean delivery; Rubella non-immune status, antepartum; and Supervision of other normal pregnancy, antepartum on their problem list.  Patient reports  left ankle swelling .  Contractions: Irritability. Vag. Bleeding: None.  Movement: Present. Denies leaking of fluid.   Left ankle swelling without pain, no tingling in the toes, no calf cramping, no bruising of the ankle, no changes in gait. States left leg has always been bigger than the other but recently noticed the swelling.    The following portions of the patient's history were reviewed and updated as appropriate: allergies, current medications, past family history, past medical history, past social history, past surgical history and problem list.   Objective:   Vitals:   08/28/22 1344  BP: 107/71  Pulse: (!) 104  Weight: 153 lb 11.2 oz (69.7 kg)    Fetal Status: Fetal Heart Rate (bpm): 158   Movement: Present     General:  Alert, oriented and cooperative. Patient is in no acute distress.  Skin: Skin is warm and dry. No rash noted.   Cardiovascular: Normal heart rate noted  Respiratory: Normal respiratory effort, no problems with respiration noted  Abdomen: Soft, gravid, appropriate for gestational age.  Pain/Pressure: Absent     Pelvic: Cervical exam deferred        Extremities: Normal range of motion.  Edema: Trace left ankle edema, non-tender, full range of motion  Mental Status: Normal mood and affect. Normal behavior. Normal judgment and thought content.   Assessment and Plan:  Pregnancy: A5W0981 at [redacted]w[redacted]d 1. Supervision of other normal pregnancy, antepartum FH and FHR  wnl  2. Rh negative state in antepartum period, first trimester S/P RHOGAM 5/14  3. Rubella non-immune status, antepartum PP MMR  4. [redacted] weeks gestation of pregnancy Doing well   5. Ankle edema Appears to be benign edema, reviewed signs/symptoms to prompt re-evaluation  6. History of cesarean delivery Scheduled for repeat cesarean at 39 weeks (7/26)   Preterm labor symptoms and general obstetric precautions including but not limited to vaginal bleeding, contractions, leaking of fluid and fetal movement were reviewed in detail with the patient. Please refer to After Visit Summary for other counseling recommendations.   No follow-ups on file.  Future Appointments  Date Time Provider Department Center  09/11/2022  1:15 PM [place] Bing, MD [facility]    Lorriane Shire, MD

## 2022-09-11 ENCOUNTER — Telehealth (INDEPENDENT_AMBULATORY_CARE_PROVIDER_SITE_OTHER): Payer: Medicaid Other | Admitting: Obstetrics and Gynecology

## 2022-09-11 DIAGNOSIS — Z98891 History of uterine scar from previous surgery: Secondary | ICD-10-CM

## 2022-09-11 DIAGNOSIS — Z87898 Personal history of other specified conditions: Secondary | ICD-10-CM

## 2022-09-11 DIAGNOSIS — Z6791 Unspecified blood type, Rh negative: Secondary | ICD-10-CM

## 2022-09-11 DIAGNOSIS — Z3A34 34 weeks gestation of pregnancy: Secondary | ICD-10-CM

## 2022-09-11 DIAGNOSIS — O26891 Other specified pregnancy related conditions, first trimester: Secondary | ICD-10-CM

## 2022-09-11 DIAGNOSIS — O34219 Maternal care for unspecified type scar from previous cesarean delivery: Secondary | ICD-10-CM

## 2022-09-11 NOTE — Progress Notes (Signed)
    TELEHEALTH OBSTETRICS VISIT ENCOUNTER NOTE  Provider location: Center for [facility]   Patient location: Home  I connected with Janet Rivers on 09/11/22 at  1:35 PM EDT by telephone at home and verified that I am speaking with the correct person using two identifiers. Of note, unable to do video encounter due to technical difficulties.    I discussed the limitations, risks, security and privacy concerns of performing an evaluation and management service by telephone and the availability of in person appointments. I also discussed with the patient that there may be a patient responsible charge related to this service. The patient expressed understanding and agreed to proceed.  Subjective:  Janet Rivers is a 27 y.o. H0W2376 at [redacted]w[redacted]d being followed for ongoing prenatal care.  She is currently monitored for the following issues for this high-risk pregnancy and has History of genital warts; Rh negative state in antepartum period, first trimester; History of anesthesia reaction; History of cesarean delivery; Rubella non-immune status, antepartum; and Supervision of other normal pregnancy, antepartum on their problem list.  Patient reports no complaints. Reports fetal movement. Denies any contractions, bleeding or leaking of fluid.   The following portions of the patient's history were reviewed and updated as appropriate: allergies, current medications, past family history, past medical history, past social history, past surgical history and problem list.   Objective:  Last menstrual period 01/20/2022. General:  Alert, oriented and cooperative.   Mental Status: Normal mood and affect perceived. Normal judgment and thought content.  Rest of physical exam deferred due to type of encounter  Assessment and Plan:  Pregnancy: G4P2012 at [redacted]w[redacted]d 1. [redacted] weeks gestation of pregnancy Pt to try and take bp and/or weight and let us know GBS swabs  next visit  2. History of cesarean delivery Already scheduled for rpt at 39wks on 7/26. Birth control options d/w her.   3. Rh negative state in antepartum period, first trimester S/p rhogam. Rpt PP PRN  Preterm labor symptoms and general obstetric precautions including but not limited to vaginal bleeding, contractions, leaking of fluid and fetal movement were reviewed in detail with the patient.  I discussed the assessment and treatment plan with the patient. The patient was provided an opportunity to ask questions and all were answered. The patient agreed with the plan and demonstrated an understanding of the instructions. The patient was advised to call back or seek an in-person office evaluation/go to [facility] at [facility] for any urgent or concerning symptoms. Please refer to After Visit Summary for other counseling recommendations.   I provided 7 minutes of non-face-to-face time during this encounter.  Return in about 2 weeks (around 09/25/2022) for low risk ob, md or app, in person.  No future appointments.  Gail Bing, MD Center for [employer], [facility]

## 2022-10-01 ENCOUNTER — Encounter (HOSPITAL_COMMUNITY): Payer: Self-pay

## 2022-10-01 NOTE — Patient Instructions (Signed)
Janet Rivers  10/01/2022   Your procedure is scheduled on:  10/11/2022  Arrive at 0730 at [employer] [facility] on [address] at [facility]  and [employer]. You are invited to use the FREE valet parking or use the Visitor's parking deck.  Pick up the phone at the desk and dial [phone].  Call this number if you have problems the morning of surgery: [phone]  Remember:   Do not eat food:(After Midnight) Desps de medianoche.  Do not drink clear liquids: (After Midnight) Desps de medianoche.  Take these medicines the morning of surgery with A SIP OF WATER:  none   Do not wear jewelry, make-up or nail polish.  Do not wear lotions, powders, or perfumes. Do not wear deodorant.  Do not shave 48 hours prior to surgery.  Do not bring valuables to the hospital.  [facility] is not   responsible for any belongings or valuables brought to the hospital.  Contacts, dentures or bridgework may not be worn into surgery.  Leave suitcase in the car. After surgery it may be brought to your room.  For patients admitted to the hospital, checkout time is 11:00 AM the day of              discharge.      Please read over the following fact sheets that you were given:     Preparing for Surgery

## 2022-10-08 ENCOUNTER — Other Ambulatory Visit: Payer: Self-pay | Admitting: Obstetrics and Gynecology

## 2022-10-08 DIAGNOSIS — Z98891 History of uterine scar from previous surgery: Secondary | ICD-10-CM

## 2022-10-08 DIAGNOSIS — Z3480 Encounter for supervision of other normal pregnancy, unspecified trimester: Secondary | ICD-10-CM

## 2022-10-08 MED ORDER — SOD CITRATE-CITRIC ACID 500-334 MG/5ML PO SOLN
30.0000 mL | ORAL | Status: DC
Start: 2022-10-08 — End: 2022-10-08

## 2022-10-08 MED ORDER — CEFAZOLIN SODIUM-DEXTROSE 2-4 GM/100ML-% IV SOLN: 2.0000 g | INTRAVENOUS | Status: DC

## 2022-10-08 MED ORDER — AZITHROMYCIN 500 MG IV SOLR
500.0000 mg | INTRAVENOUS | Status: DC
Start: 2022-10-08 — End: 2022-10-08

## 2022-10-08 NOTE — Progress Notes (Signed)
CS orders entered

## 2022-10-09 ENCOUNTER — Encounter (HOSPITAL_COMMUNITY)
Admission: RE | Admit: 2022-10-09 | Discharge: 2022-10-09 | Disposition: A | Discharge status: Home / Self Care | Payer: Medicaid Other | Source: Ambulatory Visit | Attending: Obstetrics and Gynecology | Admitting: Obstetrics and Gynecology

## 2022-10-09 DIAGNOSIS — Z01812 Encounter for preprocedural laboratory examination: Secondary | ICD-10-CM | POA: Insufficient documentation

## 2022-10-09 LAB — CBC
HCT: 36.3 % (ref 36.0–46.0)
Hemoglobin: 11.9 g/dL — ABNORMAL LOW (ref 12.0–15.0)
MCH: 32.2 pg (ref 26.0–34.0)
MCHC: 32.8 g/dL (ref 30.0–36.0)
MCV: 98.4 fL (ref 80.0–100.0)
Platelets: 175 10*3/uL (ref 150–400)
RBC: 3.69 MIL/uL — ABNORMAL LOW (ref 3.87–5.11)
RDW: 13.3 % (ref 11.5–15.5)
WBC: 7.3 10*3/uL (ref 4.0–10.5)
nRBC: 0 % (ref 0.0–0.2)

## 2022-10-09 LAB — RPR: RPR Ser Ql: NONREACTIVE

## 2022-10-09 LAB — TYPE AND SCREEN
ABO/RH(D): O NEG
Antibody Screen: POSITIVE

## 2022-10-11 ENCOUNTER — Inpatient Hospital Stay (HOSPITAL_COMMUNITY)
Admission: AD | Admit: 2022-10-11 | Discharge: 2022-10-13 | DRG: 788 | Disposition: A | Discharge status: Home / Self Care | Payer: Medicaid Other | Attending: Obstetrics and Gynecology | Admitting: Obstetrics and Gynecology

## 2022-10-11 ENCOUNTER — Other Ambulatory Visit: Payer: Self-pay

## 2022-10-11 ENCOUNTER — Encounter (HOSPITAL_COMMUNITY): Payer: Self-pay | Admitting: Obstetrics and Gynecology

## 2022-10-11 ENCOUNTER — Inpatient Hospital Stay (HOSPITAL_COMMUNITY): Payer: Medicaid Other

## 2022-10-11 ENCOUNTER — Encounter (HOSPITAL_COMMUNITY)
Admission: AD | Disposition: A | Discharge status: Home / Self Care | Payer: Self-pay | Source: Home / Self Care | Attending: Obstetrics and Gynecology

## 2022-10-11 DIAGNOSIS — O34211 Maternal care for low transverse scar from previous cesarean delivery: Secondary | ICD-10-CM | POA: Diagnosis not present

## 2022-10-11 DIAGNOSIS — Z8619 Personal history of other infectious and parasitic diseases: Secondary | ICD-10-CM | POA: Diagnosis present

## 2022-10-11 DIAGNOSIS — O26893 Other specified pregnancy related conditions, third trimester: Secondary | ICD-10-CM | POA: Diagnosis present

## 2022-10-11 DIAGNOSIS — O902 Hematoma of obstetric wound: Secondary | ICD-10-CM | POA: Diagnosis not present

## 2022-10-11 DIAGNOSIS — Z3480 Encounter for supervision of other normal pregnancy, unspecified trimester: Secondary | ICD-10-CM

## 2022-10-11 DIAGNOSIS — Z3A39 39 weeks gestation of pregnancy: Secondary | ICD-10-CM | POA: Diagnosis not present

## 2022-10-11 DIAGNOSIS — Z98891 History of uterine scar from previous surgery: Principal | ICD-10-CM

## 2022-10-11 DIAGNOSIS — Z6791 Unspecified blood type, Rh negative: Secondary | ICD-10-CM | POA: Diagnosis not present

## 2022-10-11 DIAGNOSIS — Z3A00 Weeks of gestation of pregnancy not specified: Secondary | ICD-10-CM | POA: Diagnosis not present

## 2022-10-11 DIAGNOSIS — O09899 Supervision of other high risk pregnancies, unspecified trimester: Secondary | ICD-10-CM

## 2022-10-11 DIAGNOSIS — O34219 Maternal care for unspecified type scar from previous cesarean delivery: Secondary | ICD-10-CM | POA: Diagnosis not present

## 2022-10-11 DIAGNOSIS — O26891 Other specified pregnancy related conditions, first trimester: Secondary | ICD-10-CM | POA: Diagnosis present

## 2022-10-11 DIAGNOSIS — O9902 Anemia complicating childbirth: Secondary | ICD-10-CM | POA: Diagnosis not present

## 2022-10-11 SURGERY — Surgical Case
Anesthesia: Spinal | Site: Abdomen

## 2022-10-11 MED ORDER — LACTATED RINGERS IV SOLN: INTRAVENOUS | Status: DC

## 2022-10-11 MED ORDER — ONDANSETRON HCL 4 MG/2ML IJ SOLN: 4.0000 mg | Freq: Three times a day (TID) | INTRAMUSCULAR | Status: DC | PRN

## 2022-10-11 MED ORDER — KETOROLAC TROMETHAMINE 30 MG/ML IJ SOLN
INTRAMUSCULAR | Status: AC
  Filled 2022-10-11: qty 1

## 2022-10-11 MED ORDER — SCOPOLAMINE 1 MG/3DAYS TD PT72: 1.0000 | MEDICATED_PATCH | Freq: Once | TRANSDERMAL | Status: DC

## 2022-10-11 MED ORDER — ONDANSETRON HCL 4 MG/2ML IJ SOLN
INTRAMUSCULAR | Status: AC
  Filled 2022-10-11: qty 2

## 2022-10-11 MED ORDER — SODIUM CHLORIDE 0.9% FLUSH: 3.0000 mL | INTRAVENOUS | Status: DC | PRN

## 2022-10-11 MED ORDER — ONDANSETRON HCL 4 MG/2ML IJ SOLN
INTRAMUSCULAR | Status: DC | PRN
  Administered 2022-10-11: 4 mg via INTRAVENOUS

## 2022-10-11 MED ORDER — PHENYLEPHRINE 80 MCG/ML (10ML) SYRINGE FOR IV PUSH (FOR BLOOD PRESSURE SUPPORT)
PREFILLED_SYRINGE | INTRAVENOUS | Status: AC
  Filled 2022-10-11: qty 10

## 2022-10-11 MED ORDER — OXYTOCIN-SODIUM CHLORIDE 30-0.9 UT/500ML-% IV SOLN
INTRAVENOUS | Status: AC
  Filled 2022-10-11: qty 500

## 2022-10-11 MED ORDER — GABAPENTIN 100 MG PO CAPS
200.0000 mg | ORAL_CAPSULE | Freq: Every day | ORAL | Status: DC
  Administered 2022-10-11 – 2022-10-12 (×2): 200 mg via ORAL
  Filled 2022-10-11 (×2): qty 2

## 2022-10-11 MED ORDER — MORPHINE SULFATE (PF) 0.5 MG/ML IJ SOLN
INTRAMUSCULAR | Status: DC | PRN
  Administered 2022-10-11: 150 ug via INTRATHECAL

## 2022-10-11 MED ORDER — MORPHINE SULFATE (PF) 0.5 MG/ML IJ SOLN
INTRAMUSCULAR | Status: AC
  Filled 2022-10-11: qty 10

## 2022-10-11 MED ORDER — IBUPROFEN 600 MG PO TABS
600.0000 mg | ORAL_TABLET | Freq: Four times a day (QID) | ORAL | Status: DC
  Administered 2022-10-12 – 2022-10-13 (×4): 600 mg via ORAL
  Filled 2022-10-11 (×4): qty 1

## 2022-10-11 MED ORDER — CEFAZOLIN SODIUM-DEXTROSE 2-4 GM/100ML-% IV SOLN
2.0000 g | INTRAVENOUS | Status: AC
  Administered 2022-10-11: 2 g via INTRAVENOUS

## 2022-10-11 MED ORDER — ACETAMINOPHEN 10 MG/ML IV SOLN
INTRAVENOUS | Status: DC | PRN
  Administered 2022-10-11: 1000 mg via INTRAVENOUS

## 2022-10-11 MED ORDER — KETOROLAC TROMETHAMINE 30 MG/ML IJ SOLN: 30.0000 mg | Freq: Four times a day (QID) | INTRAMUSCULAR | Status: DC | PRN

## 2022-10-11 MED ORDER — ACETAMINOPHEN 500 MG PO TABS
1000.0000 mg | ORAL_TABLET | Freq: Four times a day (QID) | ORAL | Status: DC
  Administered 2022-10-11 – 2022-10-13 (×7): 1000 mg via ORAL
  Filled 2022-10-11 (×8): qty 2

## 2022-10-11 MED ORDER — DIPHENHYDRAMINE HCL 25 MG PO CAPS
25.0000 mg | ORAL_CAPSULE | Freq: Four times a day (QID) | ORAL | Status: DC | PRN
  Administered 2022-10-11: 25 mg via ORAL
  Filled 2022-10-11: qty 1

## 2022-10-11 MED ORDER — OXYCODONE HCL 5 MG PO TABS
5.0000 mg | ORAL_TABLET | ORAL | Status: DC | PRN
  Administered 2022-10-12: 10 mg via ORAL
  Administered 2022-10-12: 5 mg via ORAL
  Administered 2022-10-12: 10 mg via ORAL
  Filled 2022-10-11 (×2): qty 2
  Filled 2022-10-11: qty 1

## 2022-10-11 MED ORDER — STERILE WATER FOR IRRIGATION IR SOLN
Status: DC | PRN
  Administered 2022-10-11: 1000 mL

## 2022-10-11 MED ORDER — PRENATAL MULTIVITAMIN CH
1.0000 | ORAL_TABLET | Freq: Every day | ORAL | Status: DC
  Administered 2022-10-12 – 2022-10-13 (×2): 1 via ORAL
  Filled 2022-10-11 (×2): qty 1

## 2022-10-11 MED ORDER — PHENYLEPHRINE HCL-NACL 20-0.9 MG/250ML-% IV SOLN
INTRAVENOUS | Status: AC
  Filled 2022-10-11: qty 250

## 2022-10-11 MED ORDER — PHENYLEPHRINE 80 MCG/ML (10ML) SYRINGE FOR IV PUSH (FOR BLOOD PRESSURE SUPPORT)
PREFILLED_SYRINGE | INTRAVENOUS | Status: DC | PRN
  Administered 2022-10-11 (×3): 80 ug via INTRAVENOUS

## 2022-10-11 MED ORDER — WITCH HAZEL-GLYCERIN EX PADS: 1.0000 | MEDICATED_PAD | CUTANEOUS | Status: DC | PRN

## 2022-10-11 MED ORDER — ACETAMINOPHEN 500 MG PO TABS: 1000.0000 mg | ORAL_TABLET | Freq: Four times a day (QID) | ORAL | Status: DC

## 2022-10-11 MED ORDER — KETOROLAC TROMETHAMINE 30 MG/ML IJ SOLN
30.0000 mg | Freq: Once | INTRAMUSCULAR | Status: AC | PRN
  Administered 2022-10-11: 30 mg via INTRAVENOUS

## 2022-10-11 MED ORDER — MEASLES, MUMPS & RUBELLA VAC IJ SOLR: 0.5000 mL | Freq: Once | INTRAMUSCULAR | Status: DC

## 2022-10-11 MED ORDER — BUPIVACAINE IN DEXTROSE 0.75-8.25 % IT SOLN
INTRATHECAL | Status: DC | PRN
  Administered 2022-10-11: 1.6 mL via INTRATHECAL

## 2022-10-11 MED ORDER — SIMETHICONE 80 MG PO CHEW
80.0000 mg | CHEWABLE_TABLET | Freq: Three times a day (TID) | ORAL | Status: DC
  Administered 2022-10-11 – 2022-10-13 (×5): 80 mg via ORAL
  Filled 2022-10-11 (×5): qty 1

## 2022-10-11 MED ORDER — KETOROLAC TROMETHAMINE 30 MG/ML IJ SOLN
30.0000 mg | Freq: Four times a day (QID) | INTRAMUSCULAR | Status: AC
  Administered 2022-10-11 – 2022-10-12 (×4): 30 mg via INTRAVENOUS
  Filled 2022-10-11 (×4): qty 1

## 2022-10-11 MED ORDER — FENTANYL CITRATE (PF) 100 MCG/2ML IJ SOLN
INTRAMUSCULAR | Status: AC
  Filled 2022-10-11: qty 2

## 2022-10-11 MED ORDER — SODIUM CHLORIDE 0.9 % IR SOLN
Status: DC | PRN
  Administered 2022-10-11: 1

## 2022-10-11 MED ORDER — DEXAMETHASONE SODIUM PHOSPHATE 10 MG/ML IJ SOLN
INTRAMUSCULAR | Status: AC
  Filled 2022-10-11: qty 1

## 2022-10-11 MED ORDER — CEFAZOLIN SODIUM-DEXTROSE 2-4 GM/100ML-% IV SOLN
INTRAVENOUS | Status: AC
  Filled 2022-10-11: qty 100

## 2022-10-11 MED ORDER — FENTANYL CITRATE (PF) 100 MCG/2ML IJ SOLN
INTRAMUSCULAR | Status: DC | PRN
  Administered 2022-10-11: 15 ug via INTRATHECAL

## 2022-10-11 MED ORDER — SIMETHICONE 80 MG PO CHEW: 80.0000 mg | CHEWABLE_TABLET | ORAL | Status: DC | PRN

## 2022-10-11 MED ORDER — DIPHENHYDRAMINE HCL 50 MG/ML IJ SOLN: 12.5000 mg | INTRAMUSCULAR | Status: DC | PRN

## 2022-10-11 MED ORDER — SOD CITRATE-CITRIC ACID 500-334 MG/5ML PO SOLN
30.0000 mL | ORAL | Status: AC
  Administered 2022-10-11: 30 mL via ORAL

## 2022-10-11 MED ORDER — FENTANYL CITRATE (PF) 100 MCG/2ML IJ SOLN: 25.0000 ug | INTRAMUSCULAR | Status: DC | PRN

## 2022-10-11 MED ORDER — DIBUCAINE (PERIANAL) 1 % EX OINT: 1.0000 | TOPICAL_OINTMENT | CUTANEOUS | Status: DC | PRN

## 2022-10-11 MED ORDER — OXYTOCIN-SODIUM CHLORIDE 30-0.9 UT/500ML-% IV SOLN
INTRAVENOUS | Status: DC | PRN
  Administered 2022-10-11: 300 mL via INTRAVENOUS

## 2022-10-11 MED ORDER — DIPHENHYDRAMINE HCL 25 MG PO CAPS: 25.0000 mg | ORAL_CAPSULE | ORAL | Status: DC | PRN

## 2022-10-11 MED ORDER — NALOXONE HCL 0.4 MG/ML IJ SOLN: 0.4000 mg | INTRAMUSCULAR | Status: DC | PRN

## 2022-10-11 MED ORDER — DEXAMETHASONE SODIUM PHOSPHATE 10 MG/ML IJ SOLN
INTRAMUSCULAR | Status: DC | PRN
  Administered 2022-10-11: 10 mg via INTRAVENOUS

## 2022-10-11 MED ORDER — PHENYLEPHRINE HCL-NACL 20-0.9 MG/250ML-% IV SOLN
INTRAVENOUS | Status: DC | PRN
  Administered 2022-10-11: 60 ug/min via INTRAVENOUS

## 2022-10-11 MED ORDER — ZOLPIDEM TARTRATE 5 MG PO TABS: 5.0000 mg | ORAL_TABLET | Freq: Every evening | ORAL | Status: DC | PRN

## 2022-10-11 MED ORDER — DEXAMETHASONE SODIUM PHOSPHATE 4 MG/ML IJ SOLN
INTRAMUSCULAR | Status: AC
  Filled 2022-10-11: qty 2

## 2022-10-11 MED ORDER — ACETAMINOPHEN 10 MG/ML IV SOLN
INTRAVENOUS | Status: AC
  Filled 2022-10-11: qty 100

## 2022-10-11 MED ORDER — COCONUT OIL OIL: 1.0000 | TOPICAL_OIL | Status: DC | PRN

## 2022-10-11 MED ORDER — MAGNESIUM HYDROXIDE 400 MG/5ML PO SUSP: 30.0000 mL | ORAL | Status: DC | PRN

## 2022-10-11 MED ORDER — SOD CITRATE-CITRIC ACID 500-334 MG/5ML PO SOLN
ORAL | Status: AC
  Filled 2022-10-11: qty 30

## 2022-10-11 MED ORDER — SENNOSIDES-DOCUSATE SODIUM 8.6-50 MG PO TABS
2.0000 | ORAL_TABLET | Freq: Every day | ORAL | Status: DC
  Administered 2022-10-12 – 2022-10-13 (×2): 2 via ORAL
  Filled 2022-10-11 (×2): qty 2

## 2022-10-11 MED ORDER — MENTHOL 3 MG MT LOZG: 1.0000 | LOZENGE | OROMUCOSAL | Status: DC | PRN

## 2022-10-11 MED ORDER — OXYTOCIN-SODIUM CHLORIDE 30-0.9 UT/500ML-% IV SOLN
2.5000 [IU]/h | INTRAVENOUS | Status: AC
  Administered 2022-10-11: 2.5 [IU]/h via INTRAVENOUS
  Filled 2022-10-11: qty 500

## 2022-10-11 MED ORDER — NALOXONE HCL 4 MG/10ML IJ SOLN: 1.0000 ug/kg/h | INTRAVENOUS | Status: DC | PRN

## 2022-10-11 MED ORDER — ENOXAPARIN SODIUM 40 MG/0.4ML IJ SOSY
40.0000 mg | PREFILLED_SYRINGE | INTRAMUSCULAR | Status: DC
  Administered 2022-10-11 – 2022-10-12 (×2): 40 mg via SUBCUTANEOUS
  Filled 2022-10-11 (×2): qty 0.4

## 2022-10-11 SURGICAL SUPPLY — 35 items
ADH SKN CLS APL DERMABOND .7 (GAUZE/BANDAGES/DRESSINGS) ×1
CHLORAPREP W/TINT 26ML (MISCELLANEOUS) ×2 IMPLANT
CLAMP UMBILICAL CORD (MISCELLANEOUS) ×1 IMPLANT
CLOTH BEACON ORANGE TIMEOUT ST (SAFETY) ×1 IMPLANT
DERMABOND ADVANCED .7 DNX12 (GAUZE/BANDAGES/DRESSINGS) ×2 IMPLANT
DRSG OPSITE POSTOP 4X10 (GAUZE/BANDAGES/DRESSINGS) ×1 IMPLANT
ELECT REM PT RETURN 9FT ADLT (ELECTROSURGICAL) ×1
ELECTRODE REM PT RTRN 9FT ADLT (ELECTROSURGICAL) ×1 IMPLANT
EXTRACTOR VACUUM KIWI (MISCELLANEOUS) IMPLANT
GAUZE SPONGE 4X4 12PLY STRL LF (GAUZE/BANDAGES/DRESSINGS) IMPLANT
GLOVE BIOGEL PI IND STRL 7.0 (GLOVE) ×1 IMPLANT
GLOVE ECLIPSE 7.5 STRL STRAW (GLOVE) ×1 IMPLANT
GOWN STRL REUS W/TWL LRG LVL3 (GOWN DISPOSABLE) ×3 IMPLANT
KIT ABG SYR 3ML LUER SLIP (SYRINGE) IMPLANT
NDL HYPO 25X5/8 SAFETYGLIDE (NEEDLE) IMPLANT
NEEDLE HYPO 25X5/8 SAFETYGLIDE (NEEDLE) IMPLANT
NS IRRIG 1000ML POUR BTL (IV SOLUTION) ×1 IMPLANT
PACK C SECTION WH (CUSTOM PROCEDURE TRAY) ×1 IMPLANT
PAD OB MATERNITY 4.3X12.25 (PERSONAL CARE ITEMS) ×1 IMPLANT
PENCIL SMOKE EVAC W/HOLSTER (ELECTROSURGICAL) ×1 IMPLANT
RTRCTR C-SECT PINK 25CM LRG (MISCELLANEOUS) ×1 IMPLANT
SUT MNCRL 0 VIOLET CTX 36 (SUTURE) ×2 IMPLANT
SUT MNCRL AB 3-0 PS2 27 (SUTURE) ×1 IMPLANT
SUT MON AB 4-0 PS1 27 (SUTURE) ×1 IMPLANT
SUT PLAIN 2 0 (SUTURE) ×1
SUT PLAIN ABS 2-0 54XMFL TIE (SUTURE) ×1 IMPLANT
SUT VIC AB 0 CT1 27 (SUTURE) ×1
SUT VIC AB 0 CT1 27XBRD ANBCTR (SUTURE) ×1 IMPLANT
SUT VIC AB 2-0 CT1 27 (SUTURE) ×1
SUT VIC AB 2-0 CT1 TAPERPNT 27 (SUTURE) ×1 IMPLANT
SUT VIC AB 4-0 KS 27 (SUTURE) ×1 IMPLANT
SUT VIC AB 4-0 PS2 27 (SUTURE) ×1 IMPLANT
TOWEL OR 17X24 6PK STRL BLUE (TOWEL DISPOSABLE) ×1 IMPLANT
TRAY FOLEY W/BAG SLVR 14FR LF (SET/KITS/TRAYS/PACK) ×1 IMPLANT
WATER STERILE IRR 1000ML POUR (IV SOLUTION) ×1 IMPLANT

## 2022-10-11 NOTE — H&P (Signed)
FACULTY PRACTICE HISTORY AND PHYSICAL NOTE  History of Present Illness: Janet Rivers is a 27 y.o. G2X5284 at [redacted]w[redacted]d presenting for scheduled CS  Denies ctx, VB, LOF. +FM.   No longer desires permanent sterilization O-/+ (rhogam), RI, NRx4, normal 2h Normal anatomy US Boy (yes), breast & formula, POPs for contraception  Patient Active Problem List   Diagnosis Date Noted   Encounter for cesarean delivery without indication 10/08/2022   Supervision of other normal pregnancy, antepartum 04/25/2022   Rubella non-immune status, antepartum 02/21/2020   History of cesarean delivery 02/19/2020   History of anesthesia reaction 01/29/2020   Rh negative state in antepartum period, first trimester 12/15/2019   History of genital warts 10/06/2019    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    OB History  Gravida Para Term Preterm AB Living  4 2 2  0 1 2  SAB IAB Ectopic Multiple Live Births  1 0 0 0 2    # Outcome Date GA Lbr Len/2nd Weight Sex Type Anes PTL Lv  4 Current           3 Term 02/19/20 [redacted]w[redacted]d  2655 g M CS-LTranv Spinal  LIV  2 SAB 05/2019          1 Term 07/29/11 [redacted]w[redacted]d  3090 g F CS-Unspec  N LIV     Complications: Genital warts complicating pregnancy    Social History   Socioeconomic  History   Marital status: Married    Spouse name: Jose   Number of children: 1   Years of education: Not on file   Highest education level: High school graduate  Occupational History   Not on file  Tobacco Use   Smoking status: Never   Smokeless tobacco: Never  Vaping Use   Vaping status: Never Used  Substance and Sexual Activity   Alcohol use: Not Currently    Comment: occ.    Drug use: No   Sexual activity: Yes    Birth control/protection: None  Other Topics Concern   Not on file  Social History Narrative   Not on file   Social Determinants of Health   Financial Resource Strain: Not on file  Food Insecurity: No Food Insecurity (10/11/2022)   Hunger Vital Sign    Worried About Running Out of Food in the Last Year: Never true    Ran Out of Food in the Last Year: Never true  Recent Concern: Food Insecurity - Food Insecurity Present (07/30/2022)   Hunger Vital Sign    Worried About Running Out of Food in the Last Year: Sometimes true    Ran Out of Food in the Last Year: Never true  Transportation Needs: No Transportation Needs (10/11/2022)   PRAPARE - Administrator, Civil Service (Medical): No  Lack of Transportation (Non-Medical): No  Physical Activity: Not on file  Stress: Not on file  Social Connections: Not on file    Family History  Problem Relation Age of Onset   Healthy Mother    Healthy Father    Cancer Paternal Aunt     No Known Allergies  Medications Prior to Admission  Medication Sig Dispense Refill Last Dose   Prenatal Vit-Fe Fumarate-FA (PREPLUS) 27-1 MG TABS Take 1 tablet by mouth daily. 30 tablet 13    Blood Pressure Monitoring (BLOOD PRESSURE KIT) DEVI 1 Device by Does not apply route as needed. (Patient not taking: Reported on 08/28/2022) 1 each 0    Misc. Devices (GOJJI WEIGHT SCALE) MISC 1 Device by Does not apply route as needed. (Patient not taking: Reported on 08/28/2022) 1 each 0     Review of Systems - Negative except as noted  in HPI  Vitals:  BP 105/72   Pulse 80   Temp 98.8 F (37.1 C) (Oral)   Resp 18   Ht 5\' 4"  (1.626 m)   Wt 71.8 kg   LMP 01/20/2022   SpO2 99%   BMI 27.17 kg/m  Physical Examination: CONSTITUTIONAL: Well-developed, well-nourished female in no acute distress.  CARDIOVASCULAR: Normal heart rate noted RESPIRATORY: Effort and breath sounds normal, no problems with respiration noted ABDOMEN: Soft, nontender, nondistended, gravid.  Labs:  No results found for this or any previous visit (from the past 24 hour(s)).  Imaging Studies: No results found.  Assessment and Plan: Patient Active Problem List   Diagnosis Date Noted   Encounter for cesarean delivery without indication 10/08/2022   Supervision of other normal pregnancy, antepartum 04/25/2022   Rubella non-immune status, antepartum 02/21/2020   History of cesarean delivery 02/19/2020   History of anesthesia reaction 01/29/2020   Rh negative state in antepartum period, first trimester 12/15/2019   History of genital warts 10/06/2019   The risks of surgery were discussed with the patient including but were not limited to: bleeding which may require transfusion, reoperation, or additional procedures like hysterectomy in the event of life-threatening hemorrhage; infection which may require antibiotics; injury to bowel, bladder, ureters or other surrounding organs; injury to the fetus; formation of adhesions; placental abnormalities with subsequent pregnancies; incisional problems; thromboembolic phenomenon and other postoperative/anesthesia complications.    The patient concurred with the proposed plan, giving informed written consent for the procedure.   Patient has been NPO since midnight and she will remain NPO for procedure. Anesthesia and OR aware. Preoperative prophylactic antibiotics and SCDs ordered on call to the OR. To OR when ready.  Harvie Bridge, MD Obstetrician & Gynecologist, Faculty Practice Faculty Practice,  [facility]

## 2022-10-11 NOTE — Discharge Summary (Signed)
Postpartum Discharge Summary  Date of Service updated-7/28     Patient Name: Janet Rivers DOB: 1995-06-27 MRN: [id number]  Date of admission: 10/11/2022 Delivery date:10/11/2022 Delivering provider: Lennart Pall Date of discharge: 10/13/2022  Admitting diagnosis: Encounter for cesarean delivery without indication [O82] Status post repeat low transverse cesarean section [Z98.891] Intrauterine pregnancy: [redacted]w[redacted]d     Secondary diagnosis:  Principal Problem:   Status post repeat low transverse cesarean section Active Problems:   History of genital warts   Rh negative state in antepartum period, first trimester   Rubella non-immune status, antepartum   Supervision of other normal pregnancy, antepartum   Encounter for cesarean delivery without indication   Maternal care due to low transverse uterine scar from previous cesarean delivery  Additional problems: none    Discharge diagnosis: Term Pregnancy Delivered                                              Post partum procedures: none Augmentation: N/A Complications: None  Hospital course: Sceduled C/S   27 y.o. yo [id number] at [redacted]w[redacted]d was admitted to the hospital 10/11/2022 for scheduled cesarean section with the following indication:Elective Repeat.Delivery details are as follows:  Membrane Rupture Time/Date: 10:13 AM,10/11/2022  Delivery Method:C-Section, Low Transverse Details of operation can be found in separate operative note.  Patient had a postpartum course that was uncomplicated.  She is ambulating, tolerating a regular diet, passing flatus, and urinating well. Patient is discharged home in stable condition on  10/13/22        Newborn Data: Birth date:10/11/2022 Birth time:10:13 AM Gender:Female Living status:Living Apgars:9 ,9  Weight:3380 g    Magnesium Sulfate received: No BMZ received: No Rhophylac:No MMR:- declined T-DaP:- given 07/30/2022 Flu: N/A Transfusion:No  Physical exam  Vitals:    10/12/22 0601 10/12/22 1453 10/12/22 2327 10/13/22 0535  BP: (!) 84/49 106/63 (!) 93/54 93/60  Pulse: 77 83 79 78  Resp: 18 18 18 18   Temp:  98.4 F (36.9 C) 98.2 F (36.8 C) 98.5 F (36.9 C)  TempSrc:  Oral Oral   SpO2: 97% 99%  99%  Weight:      Height:       General: alert, cooperative, and no distress CV: RRR Lungs; CTAB Lochia: appropriate Uterine Fundus: firm, below umbilicus Incision: C/D/I with honeycomb DVT Evaluation: No evidence of DVT seen on physical exam. Labs: Lab Results  Component Value Date   WBC 11.6 (H) 10/12/2022   HGB 9.1 (L) 10/12/2022   HCT 26.8 (L) 10/12/2022   MCV 94.0 10/12/2022   PLT 156 10/12/2022      Latest Ref Rng & Units 02/20/2020    3:32 PM  CMP  Glucose 70 - 99 mg/dL 74   BUN 6 - 20 mg/dL 8   Creatinine 1.47 - 8.29 mg/dL 5.62   Sodium 130 - 865 mmol/L 134   Potassium 3.5 - 5.1 mmol/L 3.3   Chloride 98 - 111 mmol/L 103   CO2 22 - 32 mmol/L 22   Calcium 8.9 - 10.3 mg/dL 8.9   Total Protein 6.5 - 8.1 g/dL 5.8   Total Bilirubin 0.3 - 1.2 mg/dL 1.2   Alkaline Phos 38 - 126 U/L 107   AST 15 - 41 U/L 26   ALT 0 - 44 U/L 16    Edinburgh Score:    10/13/2022  12:06 PM  Edinburgh Postnatal Depression Scale Screening Tool  I have been able to laugh and see the funny side of things. 0  I have looked forward with enjoyment to things. 1  I have blamed myself unnecessarily when things went wrong. 1  I have been anxious or worried for no good reason. 2  I have felt scared or panicky for no good reason. 2  Things have been getting on top of me. 2  I have been so unhappy that I have had difficulty sleeping. 2  I have felt sad or miserable. 1  I have been so unhappy that I have been crying. 1  The thought of harming myself has occurred to me. 0  Edinburgh Postnatal Depression Scale Total 12     After visit meds:  Allergies as of 10/13/2022   No Known Allergies      Medication List     TAKE these medications    acetaminophen  325 MG tablet Commonly known as: Tylenol Take 2 tablets (650 mg total) by mouth every 6 (six) hours as needed.   Blood Pressure Kit Devi 1 Device by Does not apply route as needed.   docusate sodium 100 MG capsule Commonly known as: Colace Take 1 capsule (100 mg total) by mouth 2 (two) times daily.   Gojji Weight Scale Misc 1 Device by Does not apply route as needed.   ibuprofen 600 MG tablet Commonly known as: ADVIL Take 1 tablet (600 mg total) by mouth every 6 (six) hours.   norethindrone 0.35 MG tablet Commonly known as: Ortho Micronor Take 1 tablet (0.35 mg total) by mouth daily.   oxyCODONE 5 MG immediate release tablet Commonly known as: Oxy IR/ROXICODONE Take 1-2 tablets (5-10 mg total) by mouth every 6 (six) hours as needed for up to 7 days for moderate pain.   PrePLUS 27-1 MG Tabs Take 1 tablet by mouth daily.         Discharge home in stable condition Infant Feeding: Bottle and Breast Infant Disposition:home with mother Discharge instruction: per After Visit Summary and Postpartum booklet. Activity: Advance as tolerated. Pelvic rest for 6 weeks.  Diet: routine diet Future Appointments:No future appointments. Follow up Visit:  Follow-up Information     Center for [facility] Follow up.   Specialty: Obstetrics and Gynecology Why: Please follow up 1-2 weeks for an incision check Contact information: [address] [place] [address] [phone]                Message sent by Berton Lan 7/26 PM  Please schedule this patient for a In person postpartum visit in 6 weeks with the following provider: Any provider. Additional Postpartum F/U:Incision check 1 week  Low risk pregnancy complicated by:  prior CS x2 Delivery mode:  C-Section, Low Transverse Anticipated Birth Control:  POPs   10/13/2022 Sharon Seller, DO

## 2022-10-11 NOTE — Lactation Note (Signed)
This note was copied from a baby's chart. Lactation Consultation Note  Patient Name: Janet Rivers EXBMW'U Date: 10/11/2022 Age:28 hours, P2, term female infant. Reason for consult: Initial assessment;Term.See Birth Parent's MR: C/S delivery   Per Birth Parent, she feels infant is latching well at the breast, RN has assisted her with latching infant at the breast, most feedings are 15 to 20 minutes in length. Birth Parent knows to BF infant according to hunger cues, on demand, 8 to 12+ times within 24 hours, skin to skin. Birth Parent last BF infant for 20 minutes at 1800 pm for 20 minutes. LC did not observe latch at this time. LC discussed hand expression using breast model and Birth Parent self expressed colostrum and knows if infant doesn't latch she can hand express and give infant back EBM. LC discussed infant's input and output and infant had 2 stools and one void since birth. Infant has been spitty on few occasions today.Birth Parent was  made aware of O/P services, breastfeeding support groups, community resources, and our phone # for post-discharge questions.    Maternal Data Has patient been taught Hand Expression?: Yes Does the patient have breastfeeding experience prior to this delivery?: Yes How long did the patient breastfeed?: Per Birth Parent, She BF her her 1st child for 3 week.  Feeding Mother's Current Feeding Choice: Breast Milk and Formula  LATCH Score   LC did not observe latch infant recently BF for 20 minutes at 1800 pm and currently sleepy at this time.                  Lactation Tools Discussed/Used    Interventions Interventions: Breast feeding basics reviewed;Assisted with latch;Position options;Skin to skin;Education;LC Services brochure  Discharge Pump: Personal;DEBP  Consult Status Consult Status: Follow-up Date: 10/12/22 Follow-up type: In-patient    Frederico Hamman 10/11/2022, 7:37 PM

## 2022-10-11 NOTE — Op Note (Signed)
Janet Rivers PROCEDURE DATE: 10/11/2022  PREOPERATIVE DIAGNOSES: Intrauterine pregnancy at 102w0d weeks gestation;  elective repeat (2 prior)  POSTOPERATIVE DIAGNOSES: The same  PROCEDURE: Repeat Low Transverse Cesarean Section  SURGEON:  Dr. Harvie Bridge  ASSISTANT:  Dr. Patrcia Dolly  ANESTHESIOLOGY TEAM: Anesthesiologist: Elmer Picker, MD CRNA: Elgie Congo, CRNA  INDICATIONS: Janet Rivers is a 27 y.o. [id number] at [redacted]w[redacted]d here for scheduled repeat cesarean section.  The risks of surgery were discussed with the patient including but were not limited to: bleeding which may require transfusion or reoperation; infection which may require antibiotics; injury to bowel, bladder, ureters or other surrounding organs; injury to the fetus; need for additional procedures including hysterectomy in the event of a life-threatening hemorrhage; formation of adhesions; placental abnormalities wth subsequent pregnancies; incisional problems; thromboembolic phenomenon and other postoperative/anesthesia complications.  The patient concurred with the proposed plan, giving informed written consent for the procedure.    FINDINGS:  Viable female infant in cephalic presentation.  Apgars 9 and 9.  Amniotic fluid: clear.  Intact placenta, three vessel cord.  Normal uterus, fallopian tubes and ovaries bilaterally. No significant intraabdominal adhesive disease. Small hematoma (2cm) at right lateral aspect of hysterotomy, stable.  ANESTHESIA: spinal INTRAVENOUS FLUIDS: 2300 ml   ESTIMATED BLOOD LOSS: 621 ml URINE OUTPUT:  50 ml SPECIMENS: Placenta sent to L&D  COMPLICATIONS: None immediate  PROCEDURE IN DETAIL:  The patient preoperatively received intravenous antibiotics and had sequential compression devices applied to her lower extremities.  She was then taken to the operating room where spinal anesthesia was found to be adequate. She was then placed in a dorsal supine  position with a leftward tilt, and prepped and draped in a sterile manner.  A foley catheter was  placed into her bladder and attached to constant gravity.  After an adequate timeout was performed, a Pfannenstiel skin incision was made with scalpel and carried through to the underlying layer of fascia. The fascia was incised in the midline, and this incision was extended bluntly. The rectus muscles were separated in the midline. Peritoneum was elevated and entered sharply. After confirming no intraabdominal adhesive disease, the peritoneum was gently stretched.   The Alexis self-retaining retractor was introduced into the abdominal cavity.  Attention was turned to the lower uterine segment where a low transverse hysterotomy was made with a scalpel and extended bilaterally bluntly.  The infant was successfully delivered, the cord was clamped and cut after one minute, and the infant was handed over to the awaiting neonatology team. Uterine massage was then administered, and the placenta delivered intact with a three-vessel cord. The uterus was then cleared of clots and debris.  The hysterotomy was closed with 0 Monocryl in a running fashion. An expanding subserosal hematoma was noted at the right lateral aspect of the hysterotomy. 2 additional figure of eights of 0 monocryl were placed for control. Careful inspection confirmed stability at around 2cm. Hematoma did not extend into the broad ligament.   The pelvis was cleared of all clot and debris. Hemostasis was confirmed on all surfaces. Hematoma again confirmed to be stable in size. The retractor was removed.  The peritoneum was closed with a 2-0 Vicryl running stitch. The fascia was then closed using 0 Vicryl in a running fashion.  The subcutaneous layer was irrigated, any areas of bleeding were cauterized with the bovie, and then hemostasis was noted. The skin was closed with a 4-0 monocryl subcuticular stitch. The patient tolerated the procedure well.  Sponge, instrument  and needle counts were correct x 3.  She was taken to the recovery room in stable condition.   Harvie Bridge, MD Obstetrician & Gynecologist, [facility] for [employer], [facility]

## 2022-10-11 NOTE — Transfer of Care (Signed)
Immediate Anesthesia Transfer of Care Note  Patient: Janet Rivers  Procedure(s) Performed: CESAREAN SECTION (Abdomen)  Patient Location: PACU  Anesthesia Type:Spinal  Level of Consciousness: awake, alert , and oriented  Airway & Oxygen Therapy: Patient Spontanous Breathing  Post-op Assessment: Report given to RN and Post -op Vital signs reviewed and stable  Post vital signs: Reviewed and stable  Last Vitals:  Vitals Value Taken Time  BP 84/55 10/11/22 1101  Temp    Pulse 80 10/11/22 1104  Resp 19 10/11/22 1104  SpO2 98 % 10/11/22 1104  Vitals shown include unfiled device data.  Last Pain:  Vitals:   10/11/22 0841  TempSrc: Oral      Patients Stated Pain Goal: 5 (10/11/22 0841)  Complications: No notable events documented.

## 2022-10-11 NOTE — Anesthesia Postprocedure Evaluation (Signed)
Anesthesia Post Note  Patient: [name]  Procedure(s) Performed: CESAREAN SECTION (Abdomen)     Patient location during evaluation: PACU Anesthesia Type: Spinal Level of consciousness: oriented and awake and alert Pain management: pain level controlled Vital Signs Assessment: post-procedure vital signs reviewed and stable Respiratory status: spontaneous breathing, respiratory function stable and patient connected to nasal cannula oxygen Cardiovascular status: blood pressure returned to baseline and stable Postop Assessment: no headache, no backache and no apparent nausea or vomiting Anesthetic complications: no  No notable events documented.  Last Vitals:  Vitals:   10/11/22 1222 10/11/22 1345  BP: 99/61 (!) 94/58  Pulse: 72 71  Resp: 15 18  Temp: 36.6 C (!) 36.4 C  SpO2: 99% 98%    Last Pain:  Vitals:   10/11/22 1445  TempSrc:   PainSc: 0-No pain   Pain Goal: Patients Stated Pain Goal: 5 (10/11/22 1145)              Epidural/Spinal Function Cutaneous sensation: Tingles (10/11/22 1445), Patient able to flex knees: Yes (10/11/22 1445), Patient able to lift hips off bed: Yes (10/11/22 1445), Back pain beyond tenderness at insertion site: No (10/11/22 1445), Progressively worsening motor and/or sensory loss: No (10/11/22 1445), Bowel and/or bladder incontinence post epidural: No (10/11/22 1445)  [name] [name]

## 2022-10-11 NOTE — Anesthesia Procedure Notes (Signed)
Spinal  Patient location during procedure: OR Start time: 10/11/2022 9:45 AM End time: 10/11/2022 9:48 AM Reason for block: surgical anesthesia Staffing Performed: anesthesiologist  Anesthesiologist: Elmer Picker, MD Performed by: Elmer Picker, MD Authorized by: Elmer Picker, MD   Preanesthetic Checklist Completed: patient identified, IV checked, risks and benefits discussed, surgical consent, monitors and equipment checked, pre-op evaluation and timeout performed Spinal Block Patient position: sitting Prep: DuraPrep and site prepped and draped Patient monitoring: cardiac monitor, continuous pulse ox and blood pressure Approach: midline Location: L3-4 Injection technique: single-shot Needle Needle type: Pencan  Needle gauge: 24 G Needle length: 9 cm Assessment Sensory level: T6 Events: CSF return Additional Notes Functioning IV was confirmed and monitors were applied. Sterile prep and drape, including hand hygiene and sterile gloves were used. The patient was positioned and the spine was prepped. The skin was anesthetized with lidocaine.  Free flow of clear CSF was obtained prior to injecting local anesthetic into the CSF.  The spinal needle aspirated freely following injection.  The needle was carefully withdrawn.  The patient tolerated the procedure well.

## 2022-10-11 NOTE — Anesthesia Preprocedure Evaluation (Addendum)
Anesthesia Evaluation  Patient identified by MRN, date of birth, ID band Patient awake    Reviewed: Allergy & Precautions, NPO status , Patient's Chart, lab work & pertinent test results  History of Anesthesia Complications (+) history of anesthetic complications (unknown cardiac arrest in 2019 during a plastic surgery in [place]. No complications during previous C/S)  Airway Mallampati: II  TM Distance: >3 FB Neck ROM: Full    Dental no notable dental hx.    Pulmonary neg pulmonary ROS   Pulmonary exam normal breath sounds clear to auscultation       Cardiovascular negative cardio ROS Normal cardiovascular exam Rhythm:Regular Rate:Normal     Neuro/Psych negative neurological ROS  negative psych ROS   GI/Hepatic negative GI ROS, Neg liver ROS,,,  Endo/Other  negative endocrine ROS    Renal/GU negative Renal ROS  negative genitourinary   Musculoskeletal negative musculoskeletal ROS (+)    Abdominal   Peds  Hematology negative hematology ROS (+)   Anesthesia Other Findings Repeat C/S  Reproductive/Obstetrics (+) Pregnancy                             Anesthesia Physical Anesthesia Plan  ASA: 2  Anesthesia Plan: Spinal   Post-op Pain Management:    Induction:   PONV Risk Score and Plan: Treatment may vary due to age or medical condition  Airway Management Planned: Natural Airway  Additional Equipment:   Intra-op Plan:   Post-operative Plan:   Informed Consent: I have reviewed the patients History and Physical, chart, labs and discussed the procedure including the risks, benefits and alternatives for the proposed anesthesia with the patient or authorized representative who has indicated his/her understanding and acceptance.     Dental advisory given  Plan Discussed with: CRNA  Anesthesia Plan Comments:        Anesthesia Quick Evaluation

## 2022-10-12 MED ORDER — FERROUS SULFATE 325 (65 FE) MG PO TABS
325.0000 mg | ORAL_TABLET | Freq: Every day | ORAL | Status: DC
  Administered 2022-10-12 – 2022-10-13 (×2): 325 mg via ORAL
  Filled 2022-10-12 (×2): qty 1

## 2022-10-12 NOTE — Progress Notes (Signed)
POSTPARTUM PROGRESS NOTE  POD #1  Subjective:  Janet Rivers is a 27 y.o. [id number] s/p erLTCS at [redacted]w[redacted]d.  She reports she doing well. No acute events overnight. She reports she is doing well. She denies any problems with ambulating, voiding or po intake. Denies nausea or vomiting. She has  passed flatus. Pain is well controlled.  Lochia is appropriate.  Objective: Blood pressure (!) 84/49, pulse 77, temperature 98.2 F (36.8 C), temperature source Oral, resp. rate 18, height 5\' 4"  (1.626 m), weight 71.8 kg, last menstrual period 01/20/2022, SpO2 97%, unknown if currently breastfeeding.  Physical Exam:  General: alert, cooperative and no distress Chest: no respiratory distress Heart:regular rate, distal pulses intact Abdomen: soft, nontender,  Uterine Fundus: firm, appropriately tender DVT Evaluation: No calf swelling or tenderness Extremities: No LE edema Skin: warm, dry; incision clean/dry/intact w/ honeycomb dressing in place  Recent Labs    10/09/22 0947 10/12/22 0527  HGB 11.9* 9.1*  HCT 36.3 26.8*    Assessment/Plan: Janet Rivers is a 27 y.o. [id number] s/p erLTCS at [redacted]w[redacted]d.  POD#1 - Doing well; pain well controlled. H/H appropriate  Routine postpartum care  OOB, ambulated  Lovenox for VTE prophylaxis Anemia: asymptomatic  Start po ferrous sulfate daily Contraception: POPs Feeding: Both  Dispo: Plan for discharge 7/28-7/29.   LOS: 1 day   Lavonda Jumbo, DO OB Fellow, Faculty Practice [facility], Center for [facility] 10/12/2022, 6:07 AM

## 2022-10-13 MED ORDER — IBUPROFEN 600 MG PO TABS: 600.0000 mg | ORAL_TABLET | Freq: Four times a day (QID) | ORAL | 0 refills | Status: DC

## 2022-10-13 MED ORDER — ACETAMINOPHEN 325 MG PO TABS: 650.0000 mg | ORAL_TABLET | Freq: Four times a day (QID) | ORAL | Status: DC | PRN

## 2022-10-13 MED ORDER — POLYETHYLENE GLYCOL 3350 17 G PO PACK
17.0000 g | PACK | Freq: Every day | ORAL | Status: DC
  Administered 2022-10-13: 17 g via ORAL
  Filled 2022-10-13: qty 1

## 2022-10-13 MED ORDER — OXYCODONE HCL 5 MG PO TABS: 5.0000 mg | ORAL_TABLET | Freq: Four times a day (QID) | ORAL | 0 refills | Status: AC | PRN

## 2022-10-13 MED ORDER — DOCUSATE SODIUM 100 MG PO CAPS: 100.0000 mg | ORAL_CAPSULE | Freq: Two times a day (BID) | ORAL | 0 refills | Status: DC

## 2022-10-13 MED ORDER — NORETHINDRONE 0.35 MG PO TABS: 1.0000 | ORAL_TABLET | Freq: Every day | ORAL | 4 refills | Status: DC

## 2022-10-13 NOTE — TOC Initial Note (Signed)
Transition of Care [facility]) - Initial/Assessment Note    Patient Details  Name: Janet Rivers MRN: [id number] Date of Birth: 1996/01/25  Transition of Care [facility]) CM/SW Contact:    Carmina Miller, LCSWA Phone Number: 10/13/2022, 1:42 PM  Clinical Narrative:                  CSW spoke with MOB for Edinburgh score of 12. CSW spoke with mom, she states she is under a lot of stress as she is in the middle of buying a house, she states her husband can't be on the mortgage due to his citizenship status, even though he is the main income earner. Mom states stress comes from the back and forth with what the bank wants and no consistency. Mom states she is on maternity leave now and doesn't know how that will affect closing. CSW inquired on whether or not mom was interested in any therapy or resources for medication management, mom states no, she believes once she closes on her home that the stress will be alleviated. Mom states she does not have a history of anxiety or depression. Mom denies any SI/HI. Mom states she has everything she needs for the baby, she states she has chosen [facility] as her PCP.        Patient Goals and CMS Choice            Expected Discharge Plan and Services         Expected Discharge Date: 10/13/22                                    Prior Living Arrangements/Services                       Activities of Daily Living Home Assistive Devices/Equipment: None ADL Screening (condition at time of admission) Patient's cognitive ability adequate to safely complete daily activities?: Yes Is the patient deaf or have difficulty hearing?: No Does the patient have difficulty seeing, even when wearing glasses/contacts?: No Does the patient have difficulty concentrating, remembering, or making decisions?: No Patient able to express need for assistance with ADLs?: Yes Does the patient have difficulty dressing or bathing?: No Independently  performs ADLs?: Yes (appropriate for developmental age) Does the patient have difficulty walking or climbing stairs?: No Weakness of Legs: None Weakness of Arms/Hands: None  Permission Sought/Granted                  Emotional Assessment              Admission diagnosis:  Encounter for cesarean delivery without indication [O82] Status post repeat low transverse cesarean section [U27.253] Patient Active Problem List   Diagnosis Date Noted   Maternal care due to low transverse uterine scar from previous cesarean delivery 10/11/2022   Encounter for cesarean delivery without indication 10/08/2022   Supervision of other normal pregnancy, antepartum 04/25/2022   Rubella non-immune status, antepartum 02/21/2020   Status post repeat low transverse cesarean section 02/19/2020   History of anesthesia reaction 01/29/2020   Rh negative state in antepartum period, first trimester 12/15/2019   History of genital warts 10/06/2019   PCP:  Pcp, No Pharmacy:   [id number] [place], [place] - [address] [place] [address] Phone: [phone] Fax: [phone]  [facility] - [place], [place] - [place] [address]  [address] [place] [address] Phone: [phone] Fax: [phone]     Social Determinants of Health (SDOH) Social History: SDOH Screenings   Food Insecurity: No Food Insecurity (10/11/2022)  Recent Concern: Food Insecurity - Food Insecurity Present (07/30/2022)  Housing: Patient Unable To Answer (10/11/2022)  Transportation Needs: No Transportation Needs (10/11/2022)  Utilities: Not At Risk (10/11/2022)  Depression (PHQ2-9): Low Risk  (07/30/2022)  Tobacco Use: Low Risk  (10/01/2022)   SDOH Interventions:     Readmission Risk Interventions     No data to display

## 2022-10-13 NOTE — Progress Notes (Signed)
POSTPARTUM PROGRESS NOTE  POD#2  Subjective:  Janet Rivers is a 27 y.o. [id number] s/p elective  at [redacted]w[redacted]d. Today she notes she has some concerns regaridng constipation- passing flatus, not yet having BM, but feels like she needs to go. She denies any problems with ambulating, voiding or po intake. Denies nausea or vomiting. Pain is manageable.  Lochia minimal. Denies fever/chills/chest pain/SOB.   Objective: Blood pressure 93/60, pulse 78, temperature 98.5 F (36.9 C), resp. rate 18, height 5\' 4"  (1.626 m), weight 71.8 kg, last menstrual period 01/20/2022, SpO2 99%, unknown if currently breastfeeding.  Physical Exam:  General: alert, cooperative and no distress Chest: no respiratory distress Heart: regular rate and rhythm Abdomen: soft, appropriately tender, +BS Uterine Fundus: firm, appropriately tender Incision: C/D/I with honeycomb DVT Evaluation: No calf swelling or tenderness Extremities: no edema Skin: warm, dry  No results found for this or any previous visit (from the past 24 hour(s)).  Assessment/Plan: Janet Rivers is a 27 y.o. [phone] s/p elective rLTCS at [redacted]w[redacted]d POD#2  - miralax added to help with BM - pain controlled - encouraged ambluation  Contraception: POPs Feeding: breast/bottle feeding  Dispo: meeting postop milestones appropriately, likely plan for discharge home later today    LOS: 2 days   Myna Hidalgo, DO Faculty Attending, Center for [facility] 10/13/2022, 11:06 AM

## 2022-10-22 ENCOUNTER — Ambulatory Visit: Payer: Medicaid Other

## 2022-10-23 ENCOUNTER — Other Ambulatory Visit: Payer: Self-pay

## 2022-10-23 ENCOUNTER — Ambulatory Visit (INDEPENDENT_AMBULATORY_CARE_PROVIDER_SITE_OTHER): Payer: Medicaid Other

## 2022-10-23 VITALS — BP 94/71 | HR 89 | Ht 64.0 in | Wt 140.3 lb

## 2022-10-23 DIAGNOSIS — Z5189 Encounter for other specified aftercare: Secondary | ICD-10-CM

## 2022-10-23 NOTE — Progress Notes (Unsigned)
Pt here today for incision check s/p rpt c-section on 10/11/22.  Pt denies VB and pain.  Incision observed to be well approximated- no odor, no drainage, no edema, and no erythema.  Pt advised to continue to monitor for sx's of infection and to contact the office with concerns and questions. Pt verbalized understanding.   Janet Rivers

## 2022-11-25 ENCOUNTER — Encounter (HOSPITAL_COMMUNITY): Payer: Self-pay

## 2022-11-25 ENCOUNTER — Other Ambulatory Visit: Payer: Self-pay

## 2022-11-25 ENCOUNTER — Emergency Department (HOSPITAL_COMMUNITY)
Admission: EM | Admit: 2022-11-25 | Discharge: 2022-11-25 | Disposition: A | Discharge status: Home / Self Care | Payer: Medicaid Other | Attending: Student | Admitting: Student

## 2022-11-25 ENCOUNTER — Emergency Department (HOSPITAL_COMMUNITY): Payer: Medicaid Other

## 2022-11-25 DIAGNOSIS — S82432A Displaced oblique fracture of shaft of left fibula, initial encounter for closed fracture: Secondary | ICD-10-CM | POA: Diagnosis not present

## 2022-11-25 DIAGNOSIS — S99912A Unspecified injury of left ankle, initial encounter: Secondary | ICD-10-CM | POA: Diagnosis present

## 2022-11-25 DIAGNOSIS — S8252XA Displaced fracture of medial malleolus of left tibia, initial encounter for closed fracture: Secondary | ICD-10-CM | POA: Diagnosis not present

## 2022-11-25 DIAGNOSIS — W010XXA Fall on same level from slipping, tripping and stumbling without subsequent striking against object, initial encounter: Secondary | ICD-10-CM | POA: Insufficient documentation

## 2022-11-25 DIAGNOSIS — M25572 Pain in left ankle and joints of left foot: Secondary | ICD-10-CM | POA: Diagnosis not present

## 2022-11-25 DIAGNOSIS — S82452A Displaced comminuted fracture of shaft of left fibula, initial encounter for closed fracture: Secondary | ICD-10-CM | POA: Diagnosis not present

## 2022-11-25 DIAGNOSIS — S8262XA Displaced fracture of lateral malleolus of left fibula, initial encounter for closed fracture: Secondary | ICD-10-CM | POA: Diagnosis not present

## 2022-11-25 DIAGNOSIS — S82892A Other fracture of left lower leg, initial encounter for closed fracture: Secondary | ICD-10-CM

## 2022-11-25 MED ORDER — NAPROXEN 375 MG PO TABS: 375.0000 mg | ORAL_TABLET | Freq: Two times a day (BID) | ORAL | 0 refills | Status: DC

## 2022-11-25 MED ORDER — OXYCODONE HCL 5 MG PO TABS: 5.0000 mg | ORAL_TABLET | Freq: Four times a day (QID) | ORAL | 0 refills | Status: DC | PRN

## 2022-11-25 MED ORDER — ACETAMINOPHEN 500 MG PO TABS: 1000.0000 mg | ORAL_TABLET | Freq: Three times a day (TID) | ORAL | 0 refills | Status: AC

## 2022-11-25 MED ORDER — NAPROXEN 250 MG PO TABS
500.0000 mg | ORAL_TABLET | Freq: Once | ORAL | Status: AC
  Administered 2022-11-25: 500 mg via ORAL
  Filled 2022-11-25: qty 2

## 2022-11-25 NOTE — ED Provider Notes (Signed)
[place] EMERGENCY DEPARTMENT AT [facility] Provider Note  CSN: [id number] Arrival date & time: 11/25/22 1019  Chief Complaint(s) Leg Pain  HPI Janet Rivers is a 27 y.o. female who presents emergency room for evaluation of left ankle pain.  Patient states that yesterday she fell off her door frame and has had pain and swelling in the ankle.  Is able to stand but is unable to walk on the leg secondary to pain.  Significant swelling noted on exam today.  Denies head strike or loss of consciousness.  Denies chest pain, shortness of breath, Donnell pain, nausea, vomiting or other systemic symptoms.  Pulses intact on arrival.   Past Medical History 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   Encounter for IUD insertion 04/12/2020   History of postpartum hemorrhage    Previous cesarean delivery, antepartum 12/15/2019   Rh negative state in antepartum period 12/15/2019   Patient Active Problem List   Diagnosis Date Noted   Maternal care due to low transverse uterine scar from previous cesarean delivery 10/11/2022   Encounter for cesarean delivery without indication 10/08/2022   Rubella non-immune status, antepartum 02/21/2020   Status post repeat low transverse cesarean section 02/19/2020   History of anesthesia reaction 01/29/2020   Rh negative state in antepartum period, first trimester 12/15/2019   History of genital warts 10/06/2019   Home Medication(s) Prior to Admission medications   Medication Sig Start Date End Date Taking? Authorizing Provider  acetaminophen (TYLENOL) 500 MG tablet Take 2 tablets (1,000 mg total) by mouth every 8 (eight) hours. 11/25/22 12/25/22 Yes Mekhai Venuto, MD  naproxen (NAPROSYN) 375 MG tablet Take 1 tablet (375 mg total) by mouth 2 (two) times daily. 11/25/22  Yes Jaquaya Coyle, MD  oxyCODONE (ROXICODONE) 5 MG  immediate release tablet Take 1 tablet (5 mg total) by mouth every 6 (six) hours as needed for breakthrough pain. 11/25/22  Yes Rome Schlauch, MD  acetaminophen (TYLENOL) 325 MG tablet Take 2 tablets (650 mg total) by mouth every 6 (six) hours as needed. 10/13/22   Myna Hidalgo, DO  Blood Pressure Monitoring (BLOOD PRESSURE KIT) DEVI 1 Device by Does not apply route as needed. Patient not taking: Reported on 08/28/2022 04/25/22   Adam Phenix, MD  docusate sodium (COLACE) 100 MG capsule Take 1 capsule (100 mg total) by mouth 2 (two) times daily. Patient not taking: Reported on 10/23/2022 10/13/22   Myna Hidalgo, DO  Misc. Devices (GOJJI WEIGHT SCALE) MISC 1 Device by Does not apply route as needed. Patient not taking: Reported on 08/28/2022 04/25/22   Adam Phenix, MD  norethindrone (ORTHO MICRONOR) 0.35 MG tablet Take 1 tablet (0.35 mg total) by mouth daily. 10/13/22 01/11/23  Myna Hidalgo, DO  Prenatal Vit-Fe Fumarate-FA (PREPLUS) 27-1 MG TABS Take 1 tablet by mouth daily. 03/12/22   Calvert Cantor, CNM  Past Surgical History Past Surgical History:  Procedure Laterality Date   CESAREAN SECTION     CESAREAN SECTION N/A 02/19/2020   Procedure: CESAREAN SECTION;  Surgeon: [place] Bing, MD;  Location: [facility] LD ORS;  Service: Obstetrics;  Laterality: N/A;   CESAREAN SECTION WITH BILATERAL TUBAL LIGATION N/A 10/11/2022   Procedure: CESAREAN SECTION;  Surgeon: Lennart Pall, MD;  Location: [facility] LD ORS;  Service: Obstetrics;  Laterality: N/A;   COSMETIC SURGERY  08/2018   was going to have fat taken from belly to buttocks, but heart stopped x4 min with anesthesia, surgery in [place].   Family History Family History  Problem Relation Age of Onset   Healthy Mother    Healthy Father    Cancer Paternal Aunt     Social History Social History   Tobacco Use    Smoking status: Never   Smokeless tobacco: Never  Vaping Use   Vaping status: Never Used  Substance Use Topics   Alcohol use: Not Currently    Comment: occ.    Drug use: No   Allergies Patient has no known allergies.  Review of Systems Review of Systems  Musculoskeletal:  Positive for arthralgias, joint swelling and myalgias.    Physical Exam Vital Signs  I have reviewed the triage vital signs BP 109/72   Pulse 84   Temp 98 F (36.7 C)   Resp 16   LMP 01/20/2022   SpO2 100%   Breastfeeding No   Physical Exam Vitals and nursing note reviewed.  Constitutional:      General: She is not in acute distress.    Appearance: She is well-developed.  HENT:     Head: Normocephalic and atraumatic.  Eyes:     Conjunctiva/sclera: Conjunctivae normal.  Cardiovascular:     Rate and Rhythm: Normal rate and regular rhythm.     Heart sounds: No murmur heard. Pulmonary:     Effort: Pulmonary effort is normal. No respiratory distress.     Breath sounds: Normal breath sounds.  Abdominal:     Palpations: Abdomen is soft.     Tenderness: There is no abdominal tenderness.  Musculoskeletal:        General: Swelling, tenderness, deformity and signs of injury present.     Cervical back: Neck supple.  Skin:    General: Skin is warm and dry.     Capillary Refill: Capillary refill takes less than 2 seconds.  Neurological:     Mental Status: She is alert.  Psychiatric:        Mood and Affect: Mood normal.     ED Results and Treatments Labs (all labs ordered are listed, but only abnormal results are displayed) Labs Reviewed - No data to display                                                                                                                        Radiology DG Ankle Complete Left  Result Date: 11/25/2022 CLINICAL DATA:  Mechanical  fall after tripping over a door frame with left ankle pain EXAM: LEFT ANKLE COMPLETE - 3 VIEW COMPARISON:  None Available. FINDINGS: Ankle  minimally displaced fracture of the medial malleolus and mildly angulated oblique fracture of the distal fibular diaphysis. Diffuse soft tissue edema about the ankle. Minimal widening of the superomedial ankle mortise. IMPRESSION: 1. Minimally displaced fracture of the medial malleolus and mildly angulated oblique fracture of the distal fibular diaphysis. 2. Minimal widening of the superomedial ankle mortise. Electronically Signed   By: Agustin Cree M.D.   On: 11/25/2022 19:14    Pertinent labs & imaging results that were available during my care of the patient were reviewed by me and considered in my medical decision making (see MDM for details).  Medications Ordered in ED Medications  naproxen (NAPROSYN) tablet 500 mg (500 mg Oral Given 11/25/22 1654)                                                                                                                                     Procedures .Ortho Injury Treatment  Date/Time: 11/25/2022 8:36 PM  Performed by: Glendora Score, MD Authorized by: Glendora Score, MD   Consent:    Consent obtained:  Verbal   Consent given by:  Patient   Risks discussed:  Fracture, nerve damage, restricted joint movement and vascular damage   Alternatives discussed:  No treatment and alternative treatmentInjury location: ankle Location details: left ankle Injury type: fracture Fracture type: medial malleolus Pre-procedure distal perfusion: normal Pre-procedure neurological function: normal Pre-procedure range of motion: reduced Immobilization: splint Splint type: short leg Splint Applied by: Ortho Tech Supplies used: Ortho-Glass Post-procedure distal perfusion: normal Post-procedure neurological function: normal Post-procedure range of motion: unchanged     (including critical care time)  Medical Decision Making / ED Course   This patient presents to the ED for concern of ankle pain, this involves an extensive number of treatment options, and is a  complaint that carries with it a high risk of complications and morbidity.  The differential diagnosis includes fracture, hematoma, contusion, sprain, ligamentous injury, dislocation  MDM: Patient seen emergency room for evaluation of ankle pain after fall.  Physical exam with significant swelling and tenderness over the left ankle but no additional signs of trauma seen elsewhere on physical exam.  Ankle x-ray concerning for medial malleolus fracture and displaced fibular fracture.  I spoke with Dr. Steward Drone of orthopedics who is recommending short leg cast, crutches, nonweightbearing and outpatient follow-up.  Patient pain controlled, splint placed by Ortho tech and patient discharged with outpatient orthopedics follow-up.  She currently does not meet inpatient criteria for admission.   Additional history obtained:  -External records from outside source obtained and reviewed including: Chart review including previous notes, labs, imaging, consultation notes     Imaging Studies ordered: I ordered imaging studies including ankle x-ray I independently visualized and interpreted imaging. I agree with the radiologist interpretation  Medicines ordered and prescription drug management: Meds ordered this encounter  Medications   naproxen (NAPROSYN) tablet 500 mg   naproxen (NAPROSYN) 375 MG tablet    Sig: Take 1 tablet (375 mg total) by mouth 2 (two) times daily.    Dispense:  20 tablet    Refill:  0   acetaminophen (TYLENOL) 500 MG tablet    Sig: Take 2 tablets (1,000 mg total) by mouth every 8 (eight) hours.    Dispense:  180 tablet    Refill:  0   oxyCODONE (ROXICODONE) 5 MG immediate release tablet    Sig: Take 1 tablet (5 mg total) by mouth every 6 (six) hours as needed for breakthrough pain.    Dispense:  10 tablet    Refill:  0    -I have reviewed the patients home medicines and have made adjustments as needed  Critical interventions none  Consultations Obtained: I requested  consultation with the orthopedic surgeon on-call Dr. Steward Drone,  and discussed lab and imaging findings as well as pertinent plan - they recommend: Splint, crutches outpatient follow-up   Social Determinants of Health:  Factors impacting patients care include: none   Reevaluation: After the interventions noted above, I reevaluated the patient and found that they have :improved  Co morbidities that complicate the patient evaluation  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   Encounter for IUD insertion 04/12/2020   History of postpartum hemorrhage    Previous cesarean delivery, antepartum 12/15/2019   Rh negative state in antepartum period 12/15/2019      Dispostion: I considered admission for this patient, but at this time she does not meet inpatient criteria for admission she is safe for discharge with outpatient follow-up     Final Clinical Impression(s) / ED Diagnoses Final diagnoses:  Closed displaced comminuted fracture of shaft of left fibula, initial encounter  Closed fracture of malleolus of left ankle, initial encounter     @PCDICTATION @    Glendora Score, MD 11/25/22 2038

## 2022-11-25 NOTE — Progress Notes (Deleted)
    Post Partum Visit Note  Janet Rivers is a 27 y.o. [phone] female who presents for a postpartum visit. She is 6 weeks postpartum following a repeat cesarean section.  I have fully reviewed the prenatal and intrapartum course. The delivery was at 39.0 gestational weeks.  Anesthesia: spinal. Postpartum course has been ***. Baby is doing well***. Baby is feeding by both breast and bottle - {formula:72}. Bleeding {vag bleed:12292}. Bowel function is {normal:32111}. Bladder function is {normal:32111}. Patient {is/is not:9024} sexually active. Contraception method is oral progesterone-only contraceptive. Postpartum depression screening: {gen negative/positive:315881}.   The pregnancy intention screening data noted above was reviewed. Potential methods of contraception were discussed. The patient elected to proceed with No data recorded.    Health Maintenance Due  Topic Date Due   INFLUENZA VACCINE  10/17/2022   COVID-19 Vaccine (1 - 2023-24 season) Never done   PAP-Cervical Cytology Screening  10/21/2022   PAP SMEAR-Modifier  10/21/2022    {Common ambulatory SmartLinks:19316}  Review of Systems {ros; complete:30496}  Objective:  LMP 01/20/2022    General:  {gen appearance:16600}   Breasts:  {desc; normal/abnormal/not indicated:14647}  Lungs: {lung exam:16931}  Heart:  {heart exam:5510}  Abdomen: {abdomen exam:16834}   Wound {Wound assessment:11097}  GU exam:  {desc; normal/abnormal/not indicated:14647}       Assessment:    There are no diagnoses linked to this encounter.  *** postpartum exam.   Plan:   Essential components of care per ACOG recommendations:  1.  Mood and well being: Patient with {gen negative/positive:315881} depression screening today. Reviewed local resources for support.  - Patient tobacco use? {tobacco use:25506}  - hx of drug use? {yes/no:25505}    2. Infant care and feeding:  -Patient currently breastmilk feeding? {yes/no:25502}   -Social determinants of health (SDOH) reviewed in EPIC. No concerns***The following needs were identified***  3. Sexuality, contraception and birth spacing - Patient {DOES_DOES HQI:69629} want a pregnancy in the next year.  Desired family size is {NUMBER 1-10:22536} children.  - Reviewed reproductive life planning. Reviewed contraceptive methods based on pt preferences and effectiveness.  Patient desired {Upstream End Methods:24109} today.   - Discussed birth spacing of 18 months  4. Sleep and fatigue -Encouraged family/partner/community support of 4 hrs of uninterrupted sleep to help with mood and fatigue  5. Physical Recovery  - Discussed patients delivery and complications. She describes her labor as {description:25511} - Patient had a {CHL AMB DELIVERY:[phone]}. Patient had a {laceration:25518} laceration. Perineal healing reviewed. Patient expressed understanding - Patient has urinary incontinence? {yes/no:25515} - Patient {ACTION; IS/IS BMW:[id number]} safe to resume physical and sexual activity  6.  Health Maintenance - HM due items addressed {Yes or If no, why not?:20788} - Last pap smear  Diagnosis  Date Value Ref Range Status  10/21/2019   Final   - Negative for intraepithelial lesion or malignancy (NILM)   Pap smear {done:10129} at today's visit.  -Breast Cancer screening indicated? {indicated:25516}  7. Chronic Disease/Pregnancy Condition follow up: {Follow up:25499}   Center for [employer], [facility]

## 2022-11-25 NOTE — Discharge Instructions (Signed)
For pain:  - Acetaminophen 1000 mg three times daily (every 8 hours) - Naproxen 2 times daily (every 12 hours) - oxycodone for breakthrough pain only 

## 2022-11-25 NOTE — Progress Notes (Signed)
Orthopedic Tech Progress Note Patient Details:  Janet Rivers [birth year] [id number]  Ortho Devices Type of Ortho Device: Stirrup splint, Post (short) splint, Crutches Splint Material: Fiberglass Ortho Device/Splint Location: LLE Ortho Device/Splint Interventions: Ordered, Application   Post Interventions Patient Tolerated: Well Instructions Provided: Poper ambulation with device, Care of device  Drishti Pepperman A [name] 11/25/2022, 7:14 PM

## 2022-11-25 NOTE — ED Triage Notes (Signed)
Pt c.o left lower leg and ankle pain after tripping and falling over a door frame yesterday.

## 2022-11-26 ENCOUNTER — Ambulatory Visit: Payer: Medicaid Other | Admitting: Family Medicine

## 2022-11-29 ENCOUNTER — Ambulatory Visit (HOSPITAL_BASED_OUTPATIENT_CLINIC_OR_DEPARTMENT_OTHER): Payer: Medicaid Other | Admitting: Student

## 2022-11-29 ENCOUNTER — Encounter (HOSPITAL_BASED_OUTPATIENT_CLINIC_OR_DEPARTMENT_OTHER): Payer: Self-pay | Admitting: Student

## 2022-11-29 ENCOUNTER — Telehealth: Payer: Self-pay | Admitting: Orthopaedic Surgery

## 2022-11-29 ENCOUNTER — Other Ambulatory Visit (HOSPITAL_BASED_OUTPATIENT_CLINIC_OR_DEPARTMENT_OTHER): Payer: Self-pay

## 2022-11-29 ENCOUNTER — Encounter (HOSPITAL_BASED_OUTPATIENT_CLINIC_OR_DEPARTMENT_OTHER): Payer: Self-pay | Admitting: Orthopaedic Surgery

## 2022-11-29 ENCOUNTER — Other Ambulatory Visit: Payer: Self-pay

## 2022-11-29 DIAGNOSIS — S82432A Displaced oblique fracture of shaft of left fibula, initial encounter for closed fracture: Secondary | ICD-10-CM | POA: Diagnosis not present

## 2022-11-29 DIAGNOSIS — S8252XA Displaced fracture of medial malleolus of left tibia, initial encounter for closed fracture: Secondary | ICD-10-CM | POA: Diagnosis not present

## 2022-11-29 MED ORDER — ASPIRIN 325 MG PO TBEC
325.0000 mg | DELAYED_RELEASE_TABLET | Freq: Every day | ORAL | 0 refills | Status: AC
  Filled 2022-11-29: qty 15, 15d supply, fill #0

## 2022-11-29 MED ORDER — IBUPROFEN 800 MG PO TABS
800.0000 mg | ORAL_TABLET | Freq: Three times a day (TID) | ORAL | 0 refills | Status: AC | PRN
  Filled 2022-11-29: qty 30, 10d supply, fill #0

## 2022-11-29 MED ORDER — OXYCODONE HCL 5 MG PO TABS
5.0000 mg | ORAL_TABLET | ORAL | 0 refills | Status: DC | PRN
  Filled 2022-11-29: qty 30, 5d supply, fill #0

## 2022-11-29 MED ORDER — ACETAMINOPHEN 500 MG PO TABS
500.0000 mg | ORAL_TABLET | Freq: Four times a day (QID) | ORAL | 0 refills | Status: AC | PRN
  Filled 2022-11-29: qty 30, 8d supply, fill #0

## 2022-11-29 NOTE — Progress Notes (Signed)
Chief Complaint: Left ankle fracture     History of Present Illness:    Janet Rivers is a 27 y.o. female presenting today for evaluation of a left ankle fracture she sustained 5 days ago.  She states that she tripped over the threshold of the door frame and felt 2 pops in the ankle.  She was seen in the emergency department for evaluation and placed in a splint after x-rays revealed fractures to the distal fibular shaft and medial malleolus.  She has been nonweightbearing to the is taking oxycodone 5 mg as well as Tylenol and naproxen.  Her pain levels are minimal and well-controlled.  Denies any numbness or tingling.   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   Encounter for IUD insertion 04/12/2020   History of postpartum hemorrhage    Previous cesarean delivery, antepartum 12/15/2019   Rh negative state in antepartum period 12/15/2019   Past Surgical History:  Procedure Laterality Date   CESAREAN SECTION     CESAREAN SECTION N/A 02/19/2020   Procedure: CESAREAN SECTION;  Surgeon: [place] Bing, MD;  Location: [facility] LD ORS;  Service: Obstetrics;  Laterality: N/A;   CESAREAN SECTION WITH BILATERAL TUBAL LIGATION N/A 10/11/2022   Procedure: CESAREAN SECTION;  Surgeon: Lennart Pall, MD;  Location: [facility] LD ORS;  Service: Obstetrics;  Laterality: N/A;   COSMETIC SURGERY  08/2018   was going to have fat taken from belly to buttocks, but heart stopped x4 min with anesthesia, surgery in [place].   Social History   Socioeconomic History   Marital status: Married    Spouse name: Jose   Number of children: 1   Years of education: Not on file   Highest education level: High school graduate  Occupational History   Not on file  Tobacco  Use   Smoking status: Never   Smokeless tobacco: Never  Vaping Use   Vaping status: Never Used  Substance and Sexual Activity   Alcohol use: Not Currently    Comment: occ.    Drug use: No   Sexual activity: Yes    Birth control/protection: None  Other Topics Concern   Not on file  Social History Narrative   Not on file   Social Determinants of Health   Financial Resource Strain: Not on file  Food Insecurity: No Food Insecurity (10/11/2022)   Hunger Vital Sign    Worried About Running Out of Food in the Last Year: Never true    Ran Out of Food in the Last Year: Never true  Recent Concern: Food Insecurity - Food Insecurity Present (07/30/2022)   Hunger Vital Sign    Worried About Running Out of Food in the Last Year: Sometimes true    Ran Out of Food in the Last Year: Never true  Transportation Needs: No Transportation Needs (10/11/2022)   PRAPARE - Administrator, Civil Service (Medical): No    Lack of Transportation (Non-Medical): No  Physical Activity: Not on file  Stress: Not on file  Social Connections: Not on file   Family History  Problem Relation Age of Onset   Healthy Mother  Healthy Father    Cancer Paternal Aunt    No Known Allergies Current Outpatient Medications  Medication Sig Dispense Refill   acetaminophen (TYLENOL) 500 MG tablet Take 1 tablet (500 mg total) by mouth every 6 (six) hours as needed for up to 14 days. 30 tablet 0   aspirin EC 325 MG tablet Take 1 tablet (325 mg total) by mouth daily for 15 days. 15 tablet 0   ibuprofen (ADVIL) 800 MG tablet Take 1 tablet (800 mg total) by mouth every 8 (eight) hours as needed for up to 14 days. 30 tablet 0   oxyCODONE (ROXICODONE) 5 MG immediate release tablet Take 1 tablet (5 mg total) by mouth every 4 (four) hours as needed for severe pain or breakthrough pain. 30 tablet 0   acetaminophen (TYLENOL) 325 MG tablet Take 2 tablets (650 mg total) by mouth every 6 (six) hours as needed.      acetaminophen (TYLENOL) 500 MG tablet Take 2 tablets (1,000 mg total) by mouth every 8 (eight) hours. 180 tablet 0   Blood Pressure Monitoring (BLOOD PRESSURE KIT) DEVI 1 Device by Does not apply route as needed. (Patient not taking: Reported on 08/28/2022) 1 each 0   docusate sodium (COLACE) 100 MG capsule Take 1 capsule (100 mg total) by mouth 2 (two) times daily. (Patient not taking: Reported on 10/23/2022) 10 capsule 0   Misc. Devices (GOJJI WEIGHT SCALE) MISC 1 Device by Does not apply route as needed. (Patient not taking: Reported on 08/28/2022) 1 each 0   naproxen (NAPROSYN) 375 MG tablet Take 1 tablet (375 mg total) by mouth 2 (two) times daily. 20 tablet 0   norethindrone (ORTHO MICRONOR) 0.35 MG tablet Take 1 tablet (0.35 mg total) by mouth daily. 90 tablet 4   Prenatal Vit-Fe Fumarate-FA (PREPLUS) 27-1 MG TABS Take 1 tablet by mouth daily. 30 tablet 13   No current facility-administered medications for this visit.   No results found.  Review of Systems:   A ROS was performed including pertinent positives and negatives as documented in the HPI.  Physical Exam :   Constitutional: NAD and appears stated age Neurological: Alert and oriented Psych: Appropriate affect and cooperative not currently breastfeeding.   Comprehensive Musculoskeletal Exam:    Patient remains in a short leg splint.  All 5 toes of the left foot are warm and well-perfused with normal capillary refill.  Able to flex and extend all 5 toes.  Distal neurosensory exam intact.  Imaging:   Xray (left ankle 3 views): Oblique fracture of the distal fibular shaft with mild displacement.  Transverse fracture of the medial malleolus with widening of the ankle mortise.    I personally reviewed and interpreted the radiographs.   Assessment:   27 y.o. female with distal fibula and medial malleolus fractures with associated disruption of the ankle mortise.  After discussion of treatment options including risks and benefits  of operative management as well as consultation with Dr. Steward Drone, patient is agreeable to proceed with surgical repair with ORIF.  This will likely be scheduled for early next week based on availability.  I will leave her in the splint today and will have her remain nonweightbearing.  She can continue with Tylenol and naproxen as needed for pain, I would like her to begin weaning off of oxycodone as tolerated.  All other questions welcomed and addressed today.  Plan :    -Plan for left ankle ORIF with Dr. Steward Drone -Postop meds sent to pharmacy and  walking boot given for day of surgery     I personally saw and evaluated the patient, and participated in the management and treatment plan.  Hazle Nordmann, PA-C Orthopedics

## 2022-11-29 NOTE — Telephone Encounter (Signed)
Patient is scheduled for surgery on 12/03/22, and would like to know when would she possibly be able to go to work after this? Please call to advise.

## 2022-11-29 NOTE — Telephone Encounter (Signed)
RC to patient to answer work question. She is currently on maternity leave but works at [employer]. She is regularly required to be mobile at her job and I informed her because of that she likely will not be able to return until November. Patient voiced understanding. No further questions asked

## 2022-12-02 ENCOUNTER — Ambulatory Visit (HOSPITAL_BASED_OUTPATIENT_CLINIC_OR_DEPARTMENT_OTHER): Payer: Medicaid Other | Admitting: Student

## 2022-12-03 ENCOUNTER — Ambulatory Visit (HOSPITAL_COMMUNITY): Payer: Medicaid Other

## 2022-12-03 ENCOUNTER — Ambulatory Visit (HOSPITAL_BASED_OUTPATIENT_CLINIC_OR_DEPARTMENT_OTHER): Payer: Medicaid Other | Admitting: Certified Registered"

## 2022-12-03 ENCOUNTER — Ambulatory Visit: Payer: Self-pay | Admitting: Orthopaedic Surgery

## 2022-12-03 ENCOUNTER — Encounter (HOSPITAL_BASED_OUTPATIENT_CLINIC_OR_DEPARTMENT_OTHER)
Admission: RE | Disposition: A | Discharge status: Home / Self Care | Payer: Self-pay | Source: Ambulatory Visit | Attending: Orthopaedic Surgery

## 2022-12-03 ENCOUNTER — Other Ambulatory Visit: Payer: Self-pay

## 2022-12-03 ENCOUNTER — Ambulatory Visit (HOSPITAL_BASED_OUTPATIENT_CLINIC_OR_DEPARTMENT_OTHER)
Admission: RE | Admit: 2022-12-03 | Discharge: 2022-12-03 | Disposition: A | Discharge status: Home / Self Care | Payer: Medicaid Other | Source: Ambulatory Visit | Attending: Orthopaedic Surgery | Admitting: Orthopaedic Surgery

## 2022-12-03 DIAGNOSIS — S82892A Other fracture of left lower leg, initial encounter for closed fracture: Secondary | ICD-10-CM | POA: Diagnosis not present

## 2022-12-03 DIAGNOSIS — S8292XD Unspecified fracture of left lower leg, subsequent encounter for closed fracture with routine healing: Secondary | ICD-10-CM | POA: Diagnosis not present

## 2022-12-03 DIAGNOSIS — S8252XA Displaced fracture of medial malleolus of left tibia, initial encounter for closed fracture: Secondary | ICD-10-CM | POA: Diagnosis not present

## 2022-12-03 DIAGNOSIS — S82432A Displaced oblique fracture of shaft of left fibula, initial encounter for closed fracture: Secondary | ICD-10-CM

## 2022-12-03 DIAGNOSIS — S82852A Displaced trimalleolar fracture of left lower leg, initial encounter for closed fracture: Secondary | ICD-10-CM

## 2022-12-03 DIAGNOSIS — W1840XA Slipping, tripping and stumbling without falling, unspecified, initial encounter: Secondary | ICD-10-CM | POA: Diagnosis not present

## 2022-12-03 DIAGNOSIS — S82402A Unspecified fracture of shaft of left fibula, initial encounter for closed fracture: Secondary | ICD-10-CM | POA: Diagnosis not present

## 2022-12-03 DIAGNOSIS — Z01818 Encounter for other preprocedural examination: Secondary | ICD-10-CM

## 2022-12-03 DIAGNOSIS — G8918 Other acute postprocedural pain: Secondary | ICD-10-CM | POA: Diagnosis not present

## 2022-12-03 HISTORY — PX: ORIF ANKLE FRACTURE: SHX5408

## 2022-12-03 LAB — POCT PREGNANCY, URINE: Preg Test, Ur: NEGATIVE

## 2022-12-03 SURGERY — OPEN REDUCTION INTERNAL FIXATION (ORIF) ANKLE FRACTURE
Anesthesia: Regional | Site: Ankle | Laterality: Left

## 2022-12-03 MED ORDER — ONDANSETRON HCL 4 MG/2ML IJ SOLN
INTRAMUSCULAR | Status: DC | PRN
Start: 2022-12-03 — End: 2022-12-03
  Administered 2022-12-03: 4 mg via INTRAVENOUS

## 2022-12-03 MED ORDER — OXYCODONE HCL 5 MG PO TABS
5.0000 mg | ORAL_TABLET | Freq: Once | ORAL | Status: AC | PRN
  Administered 2022-12-03: 5 mg via ORAL

## 2022-12-03 MED ORDER — DEXAMETHASONE SODIUM PHOSPHATE 10 MG/ML IJ SOLN
INTRAMUSCULAR | Status: DC | PRN
  Administered 2022-12-03: 10 mg

## 2022-12-03 MED ORDER — CEFAZOLIN SODIUM-DEXTROSE 2-3 GM-%(50ML) IV SOLR
INTRAVENOUS | Status: DC | PRN
  Administered 2022-12-03: 2 g via INTRAVENOUS

## 2022-12-03 MED ORDER — TRANEXAMIC ACID-NACL 1000-0.7 MG/100ML-% IV SOLN
1000.0000 mg | INTRAVENOUS | Status: AC
  Administered 2022-12-03: 1000 mg via INTRAVENOUS

## 2022-12-03 MED ORDER — DEXAMETHASONE SODIUM PHOSPHATE 10 MG/ML IJ SOLN
INTRAMUSCULAR | Status: DC | PRN
  Administered 2022-12-03: 5 mg via INTRAVENOUS

## 2022-12-03 MED ORDER — MIDAZOLAM HCL 2 MG/2ML IJ SOLN
2.0000 mg | Freq: Once | INTRAMUSCULAR | Status: AC
  Administered 2022-12-03: 2 mg via INTRAVENOUS

## 2022-12-03 MED ORDER — ACETAMINOPHEN 500 MG PO TABS
ORAL_TABLET | ORAL | Status: AC
  Filled 2022-12-03: qty 2

## 2022-12-03 MED ORDER — FENTANYL CITRATE (PF) 100 MCG/2ML IJ SOLN
INTRAMUSCULAR | Status: DC | PRN
  Administered 2022-12-03 (×4): 50 ug via INTRAVENOUS

## 2022-12-03 MED ORDER — DEXAMETHASONE SODIUM PHOSPHATE 10 MG/ML IJ SOLN
INTRAMUSCULAR | Status: AC
  Filled 2022-12-03: qty 1

## 2022-12-03 MED ORDER — FENTANYL CITRATE (PF) 100 MCG/2ML IJ SOLN
25.0000 ug | INTRAMUSCULAR | Status: DC | PRN
  Administered 2022-12-03: 50 ug via INTRAVENOUS

## 2022-12-03 MED ORDER — DROPERIDOL 2.5 MG/ML IJ SOLN: 0.6250 mg | Freq: Once | INTRAMUSCULAR | Status: DC | PRN

## 2022-12-03 MED ORDER — OXYCODONE HCL 5 MG/5ML PO SOLN: 5.0000 mg | Freq: Once | ORAL | Status: AC | PRN

## 2022-12-03 MED ORDER — GABAPENTIN 300 MG PO CAPS
300.0000 mg | ORAL_CAPSULE | Freq: Once | ORAL | Status: AC
  Administered 2022-12-03: 300 mg via ORAL

## 2022-12-03 MED ORDER — CEFAZOLIN SODIUM-DEXTROSE 2-4 GM/100ML-% IV SOLN
INTRAVENOUS | Status: AC
  Filled 2022-12-03: qty 100

## 2022-12-03 MED ORDER — VANCOMYCIN HCL 1000 MG IV SOLR
INTRAVENOUS | Status: AC
  Filled 2022-12-03: qty 20

## 2022-12-03 MED ORDER — EPHEDRINE SULFATE (PRESSORS) 50 MG/ML IJ SOLN
INTRAMUSCULAR | Status: DC | PRN
  Administered 2022-12-03: 10 mg via INTRAVENOUS

## 2022-12-03 MED ORDER — ROPIVACAINE HCL 5 MG/ML IJ SOLN
INTRAMUSCULAR | Status: DC | PRN
Start: 2022-12-03 — End: 2022-12-03
  Administered 2022-12-03: 40 mL via PERINEURAL

## 2022-12-03 MED ORDER — FENTANYL CITRATE (PF) 100 MCG/2ML IJ SOLN
100.0000 ug | Freq: Once | INTRAMUSCULAR | Status: AC
  Administered 2022-12-03: 100 ug via INTRAVENOUS

## 2022-12-03 MED ORDER — LIDOCAINE 2% (20 MG/ML) 5 ML SYRINGE
INTRAMUSCULAR | Status: AC
  Filled 2022-12-03: qty 5

## 2022-12-03 MED ORDER — FENTANYL CITRATE (PF) 100 MCG/2ML IJ SOLN
INTRAMUSCULAR | Status: AC
  Filled 2022-12-03: qty 2

## 2022-12-03 MED ORDER — PROPOFOL 10 MG/ML IV BOLUS
INTRAVENOUS | Status: DC | PRN
  Administered 2022-12-03: 150 mg via INTRAVENOUS

## 2022-12-03 MED ORDER — OXYCODONE HCL 5 MG PO TABS
ORAL_TABLET | ORAL | Status: AC
  Filled 2022-12-03: qty 1

## 2022-12-03 MED ORDER — LACTATED RINGERS IV SOLN: INTRAVENOUS | Status: DC

## 2022-12-03 MED ORDER — DEXMEDETOMIDINE HCL IN NACL 80 MCG/20ML IV SOLN
INTRAVENOUS | Status: DC | PRN
Start: 2022-12-03 — End: 2022-12-03
  Administered 2022-12-03 (×2): 8 ug via INTRAVENOUS

## 2022-12-03 MED ORDER — MIDAZOLAM HCL 2 MG/2ML IJ SOLN
INTRAMUSCULAR | Status: DC | PRN
  Administered 2022-12-03: 2 mg via INTRAVENOUS

## 2022-12-03 MED ORDER — LIDOCAINE HCL (CARDIAC) PF 100 MG/5ML IV SOSY
PREFILLED_SYRINGE | INTRAVENOUS | Status: DC | PRN
  Administered 2022-12-03: 60 mg via INTRAVENOUS

## 2022-12-03 MED ORDER — GABAPENTIN 300 MG PO CAPS
ORAL_CAPSULE | ORAL | Status: AC
  Filled 2022-12-03: qty 1

## 2022-12-03 MED ORDER — ACETAMINOPHEN 500 MG PO TABS
1000.0000 mg | ORAL_TABLET | Freq: Once | ORAL | Status: AC
  Administered 2022-12-03: 1000 mg via ORAL

## 2022-12-03 MED ORDER — PROPOFOL 10 MG/ML IV BOLUS
INTRAVENOUS | Status: AC
  Filled 2022-12-03: qty 20

## 2022-12-03 MED ORDER — ONDANSETRON HCL 4 MG/2ML IJ SOLN
INTRAMUSCULAR | Status: AC
  Filled 2022-12-03: qty 2

## 2022-12-03 MED ORDER — MIDAZOLAM HCL 2 MG/2ML IJ SOLN
INTRAMUSCULAR | Status: AC
  Filled 2022-12-03: qty 2

## 2022-12-03 MED ORDER — KETOROLAC TROMETHAMINE 30 MG/ML IJ SOLN
INTRAMUSCULAR | Status: DC | PRN
Start: 2022-12-03 — End: 2022-12-03
  Administered 2022-12-03: 30 mg via INTRAVENOUS

## 2022-12-03 MED ORDER — TRANEXAMIC ACID-NACL 1000-0.7 MG/100ML-% IV SOLN
INTRAVENOUS | Status: AC
  Filled 2022-12-03: qty 100

## 2022-12-03 MED ORDER — CEFAZOLIN SODIUM-DEXTROSE 2-4 GM/100ML-% IV SOLN: 2.0000 g | INTRAVENOUS | Status: DC

## 2022-12-03 MED ORDER — LACTATED RINGERS IV SOLN: INTRAVENOUS | Status: DC | PRN

## 2022-12-03 MED ORDER — 0.9 % SODIUM CHLORIDE (POUR BTL) OPTIME
TOPICAL | Status: DC | PRN
Start: 2022-12-03 — End: 2022-12-03
  Administered 2022-12-03: 1000 mL

## 2022-12-03 SURGICAL SUPPLY — 85 items
APL PRP STRL LF DISP 70% ISPRP (MISCELLANEOUS) ×2
BANDAGE ESMARK 6X9 LF (GAUZE/BANDAGES/DRESSINGS) IMPLANT
BIT DRILL 2.4 AO COUPLING CANN (BIT) IMPLANT
BIT DRILL SHORT 2.5 (BIT) IMPLANT
BIT DRL SHORT 2.5 (BIT) ×1
BLADE SURG 15 STRL LF DISP TIS (BLADE) ×2 IMPLANT
BLADE SURG 15 STRL SS (BLADE) ×3
BNDG CMPR 5X4 CHSV STRCH STRL (GAUZE/BANDAGES/DRESSINGS) ×1
BNDG CMPR 5X4 KNIT ELC UNQ LF (GAUZE/BANDAGES/DRESSINGS) ×1
BNDG CMPR 6 X 5 YARDS HK CLSR (GAUZE/BANDAGES/DRESSINGS) ×1
BNDG CMPR 9X6 STRL LF SNTH (GAUZE/BANDAGES/DRESSINGS) ×1
BNDG COHESIVE 4X5 TAN STRL LF (GAUZE/BANDAGES/DRESSINGS) ×1 IMPLANT
BNDG ELASTIC 4INX 5YD STR LF (GAUZE/BANDAGES/DRESSINGS) ×1 IMPLANT
BNDG ELASTIC 6INX 5YD STR LF (GAUZE/BANDAGES/DRESSINGS) IMPLANT
BNDG ESMARK 6X9 LF (GAUZE/BANDAGES/DRESSINGS) ×1
BOOT STEPPER DURA LG (SOFTGOODS) IMPLANT
BOOT STEPPER DURA MED (SOFTGOODS) IMPLANT
CHLORAPREP W/TINT 26 (MISCELLANEOUS) ×1 IMPLANT
COVER BACK TABLE 60X90IN (DRAPES) ×1 IMPLANT
CUFF TOURN SGL QUICK 24 (TOURNIQUET CUFF)
CUFF TOURN SGL QUICK 34 (TOURNIQUET CUFF)
CUFF TRNQT CYL 24X4X16.5-23 (TOURNIQUET CUFF) IMPLANT
CUFF TRNQT CYL 34X4.125X (TOURNIQUET CUFF) IMPLANT
DRAPE C-ARM 42X72 X-RAY (DRAPES) ×1 IMPLANT
DRAPE C-ARMOR (DRAPES) ×1 IMPLANT
DRAPE EXTREMITY T 121X128X90 (DISPOSABLE) ×1 IMPLANT
DRAPE IMP U-DRAPE 54X76 (DRAPES) ×1 IMPLANT
DRAPE U-SHAPE 47X51 STRL (DRAPES) ×1 IMPLANT
ELECT REM PT RETURN 9FT ADLT (ELECTROSURGICAL) ×1
ELECTRODE REM PT RTRN 9FT ADLT (ELECTROSURGICAL) ×1 IMPLANT
FIXATION ZIPTIGHT ANKLE SNDSMS (Ankle) IMPLANT
GAUZE PAD ABD 8X10 STRL (GAUZE/BANDAGES/DRESSINGS) ×1 IMPLANT
GAUZE SPONGE 4X4 12PLY STRL (GAUZE/BANDAGES/DRESSINGS) ×1 IMPLANT
GAUZE XEROFORM 1X8 LF (GAUZE/BANDAGES/DRESSINGS) ×1 IMPLANT
GLOVE BIO SURGEON STRL SZ 6 (GLOVE) ×1 IMPLANT
GLOVE BIO SURGEON STRL SZ7.5 (GLOVE) ×1 IMPLANT
GLOVE BIOGEL PI IND STRL 6.5 (GLOVE) ×1 IMPLANT
GLOVE BIOGEL PI IND STRL 8 (GLOVE) ×1 IMPLANT
GLOVE SURG SYN 7.5 E (GLOVE)
GLOVE SURG SYN 7.5 PF PI (GLOVE) ×1 IMPLANT
GOWN STRL REUS W/ TWL LRG LVL3 (GOWN DISPOSABLE) ×2 IMPLANT
GOWN STRL REUS W/ TWL XL LVL3 (GOWN DISPOSABLE) ×1 IMPLANT
GOWN STRL REUS W/TWL LRG LVL3 (GOWN DISPOSABLE) ×2
GOWN STRL REUS W/TWL XL LVL3 (GOWN DISPOSABLE) ×1
K-WIRE ALPS MXV 1.6X6 ZI (WIRE) ×1
K-WIRE TROC 1.25X150 (WIRE) ×2
KWIRE ALPS MXV 1.6X6 ZI (WIRE) IMPLANT
KWIRE TROC 1.25X150 (WIRE) IMPLANT
NDL HYPO 22X1.5 SAFETY MO (MISCELLANEOUS) IMPLANT
NEEDLE HYPO 22X1.5 SAFETY MO (MISCELLANEOUS)
NS IRRIG 1000ML POUR BTL (IV SOLUTION) ×1 IMPLANT
PACK BASIN DAY SURGERY FS (CUSTOM PROCEDURE TRAY) ×1 IMPLANT
PAD CAST 4YDX4 CTTN HI CHSV (CAST SUPPLIES) IMPLANT
PADDING CAST ABS COTTON 4X4 ST (CAST SUPPLIES) ×2 IMPLANT
PADDING CAST COTTON 4X4 STRL (CAST SUPPLIES)
PADDING CAST COTTON 6X4 STRL (CAST SUPPLIES) IMPLANT
PENCIL SMOKE EVACUATOR (MISCELLANEOUS) ×1 IMPLANT
PLATE 1/3 TUBULAR 6H (Plate) IMPLANT
SCREW CANN 4.0X50 (Screw) ×2 IMPLANT
SCREW CANN PT 50X4 NS SM (Screw) IMPLANT
SCREW NON-LOCK 3.5X10 (Screw) IMPLANT
SCREW NON-LOCK 3.5X12 (Screw) IMPLANT
SLEEVE SCD COMPRESS KNEE MED (STOCKING) ×1 IMPLANT
SPIKE FLUID TRANSFER (MISCELLANEOUS) IMPLANT
SPLINT PLASTER CAST FAST 5X30 (CAST SUPPLIES) IMPLANT
SPONGE T-LAP 18X18 ~~LOC~~+RFID (SPONGE) ×1 IMPLANT
STOCKINETTE 6 STRL (DRAPES) ×1 IMPLANT
SUCTION TUBE FRAZIER 10FR DISP (SUCTIONS) ×1 IMPLANT
SUT ETHILON 3 0 PS 1 (SUTURE) ×1 IMPLANT
SUT MNCRL AB 3-0 PS2 27 (SUTURE) IMPLANT
SUT MON AB 2-0 CT1 36 (SUTURE) IMPLANT
SUT VIC AB 0 CT1 27 (SUTURE) ×1
SUT VIC AB 0 CT1 27XBRD ANBCTR (SUTURE) ×1 IMPLANT
SUT VIC AB 2-0 CT1 27 (SUTURE) ×1
SUT VIC AB 2-0 CT1 TAPERPNT 27 (SUTURE) IMPLANT
SUT VIC AB 2-0 PS2 27 (SUTURE) IMPLANT
SUT VIC AB 2-0 SH 27 (SUTURE)
SUT VIC AB 2-0 SH 27XBRD (SUTURE) IMPLANT
SYR BULB EAR ULCER 3OZ GRN STR (SYRINGE) ×1 IMPLANT
SYR CONTROL 10ML LL (SYRINGE) IMPLANT
TOWEL GREEN STERILE FF (TOWEL DISPOSABLE) ×2 IMPLANT
TUBE CONNECTING 20X1/4 (TUBING) ×1 IMPLANT
TUBE SUCTION HIGH CAP CLEAR NV (SUCTIONS) ×1 IMPLANT
UNDERPAD 30X36 HEAVY ABSORB (UNDERPADS AND DIAPERS) ×1 IMPLANT
ZIPTIGHT ANKLE SYNODESMOSS FIX (Ankle) ×1 IMPLANT

## 2022-12-03 NOTE — Anesthesia Procedure Notes (Signed)
Anesthesia Regional Block: Popliteal block   Pre-Anesthetic Checklist: , timeout performed,  Correct Patient, Correct Site, Correct Laterality,  Correct Procedure, Correct Position, site marked,  Risks and benefits discussed,  Surgical consent,  Pre-op evaluation,  At surgeon's request and post-op pain management  Laterality: Left  Prep: Dura Prep       Needles:  Injection technique: Single-shot  Needle Type: Echogenic Stimulator Needle     Needle Length: 10cm  Needle Gauge: 20     Additional Needles:   Procedures:,,,, ultrasound used (permanent image in chart),,    Narrative:  Start time: 12/03/2022 1:18 PM End time: 12/03/2022 1:20 PM Injection made incrementally with aspirations every 5 mL.  Performed by: Personally  Anesthesiologist: Atilano Median, DO  Additional Notes: Patient identified. Risks/Benefits/Options discussed with patient including but not limited to bleeding, infection, nerve damage, failed block, incomplete pain control. Patient expressed understanding and wished to proceed. All questions were answered. Sterile technique was used throughout the entire procedure. Please see nursing notes for vital signs. Aspirated in 5cc intervals with injection for negative confirmation. Patient was given instructions on fall risk and not to get out of bed. All questions and concerns addressed with instructions to call with any issues or inadequate analgesia.

## 2022-12-03 NOTE — Op Note (Signed)
Date of Surgery: 12/03/2022  INDICATIONS: Janet Rivers is a 27 y.o.-year-old female with displaced left ankle fracture.  The risk and benefits of the procedure were discussed in detail and documented in the pre-operative evaluation.   PREOPERATIVE DIAGNOSIS: 1.  Displaced left trimalleolar ankle fracture  POSTOPERATIVE DIAGNOSIS: Same.  PROCEDURE: 1.  Open reduction internal fixation left fibula and medial malleolus 2.  Open treatment of left distal tibia fibular joint  SURGEON: Benancio Deeds MD  ASSISTANT: Kerby Less, ATC  ANESTHESIA:  general plus peripheral nerve block    IV FLUIDS AND URINE: See anesthesia record.  ANTIBIOTICS: Ancef  ESTIMATED BLOOD LOSS: 25 mL.  IMPLANTS:  Implant Name Type Inv. Item Serial No. Manufacturer Lot No. LRB No. Used Action  ZIPTIGHT ANKLE SYNODESMOSS FIX - [id number] Ankle ZIPTIGHT ANKLE SYNODESMOSS FIX  ZIMMER RECON(ORTH,TRAU,BIO,SG) [id number] Left 1 Implanted  PLATE 1/3 TUBULAR 6H - [id number] Plate PLATE 1/3 TUBULAR 6H  ZIMMER RECON(ORTH,TRAU,BIO,SG) ON STERILE TRAY Left 1 Implanted  SCREW NON-LOCK 3.5X10 - [id number] Screw SCREW NON-LOCK 3.5X10  ZIMMER RECON(ORTH,TRAU,BIO,SG) ON STERILE TRAY Left 2 Implanted  SCREW NON-LOCK 3.5X12 - [id number] Screw SCREW NON-LOCK 3.5X12  ZIMMER RECON(ORTH,TRAU,BIO,SG) ON STERILE TRAY Left 4 Implanted  SCREW CANN 4.0X50 - [id number] Screw SCREW CANN 4.0X50  ZIMMER RECON(ORTH,TRAU,BIO,SG) ON STERILE TRAY Left 2 Implanted    DRAINS: None  CULTURES: None  COMPLICATIONS: none  DESCRIPTION OF PROCEDURE:  I identified the patient in the pre-operative holding area.  I marked the operative left with my initials. I reviewed the risks and benefits of the proposed surgical intervention and the patient (and/or patient's guardian) wished to proceed.  Anesthesia was then performed with a regional block.    SCDs were placed on the non-operative lower extremity. Appropriate antibiotics was  administered within 1 hour before incision. The operative extremity was then prepped and draped in standard fashion. A time out was performed confirming the correct extremity, correct patient and correct procedure.   A lateral incision over the fibula was made centered around the fracture. The incision was taken sharply down to bone. Full thickness anterior and posterior flaps were made. The fracture site was cleared of debris. Reduction clamps were used to mobilize the fracture ends and hold the fracture in place. Fluoroscopy was used to confirm proper alignment.   A straight fibular plate was placed and fluoroscopy was used to confirm proper placement. The plate was secured with 3x 3.24mm screws proximally and 3 screws distally. Fluoroscopy was used to confirm proper placement of screws.  Next, a fluoroscopy stress test was performed with comparison to the previously obtained contralateral side. A mortise view was obtained and then an external rotation force was applied and widening was noted. Therefore, the decision was made to perform syndesmosis fixation.  Distal to the plate, a wire was placed from the fibula to the tibia with a 30 degree posterior to anterior angulation. This was performed under fluoroscopic guidance to insure proper distance and trajectory with the joint line. Next, a  cannulated drill was used to over drill the guide wire. Next, the ziptight device was placed and secured to the plate with manual reduction of the syndesmosis.  Fluoroscopy was performed and compared to the previously obtained contralateral side. Appropriate reduction was confirmed.  Next, attention was turned to the medial side. A longitudinal incision was made along the anterior portion of the medial malleolus. The saphenous vein was protected and the incision was taken down to bone and full-thickness anterior and posterior  flaps were created. The fracture site was cleared of debris. A dental pick was used to  reduce the fracture. Two pins were placed from the distal aspect of the medial malleolus into the tibial bone with care not to penetrate the joint. The screws were overdrilled. Two  screws were placed. Fluoroscopy was used to confirm placement.  The wounds were thoroughly irrigated. The incisions were closed with vertical mattress 3-0 nylons. The incisions were dressed with xeroform, 4x4s, webril, and bias. A boot was placed.   POSTOPERATIVE PLAN: Nonweightbearing for total of 2 weeks in the cam boot.  At times she will progress range of motion and weightbearing.  She was placed on aspirin for blood clot prevention.  Benancio Deeds, MD 3:26 PM

## 2022-12-03 NOTE — Anesthesia Postprocedure Evaluation (Signed)
Anesthesia Post Note  Patient: [name]  Procedure(s) Performed: LEFT OPEN REDUCTION INTERNAL FIXATION (ORIF) ANKLE FRACTURE (Left: Ankle)     Patient location during evaluation: PACU Anesthesia Type: Regional and General Level of consciousness: awake and alert Pain management: pain level controlled Vital Signs Assessment: post-procedure vital signs reviewed and stable Respiratory status: spontaneous breathing, nonlabored ventilation, respiratory function stable and patient connected to nasal cannula oxygen Cardiovascular status: blood pressure returned to baseline and stable Postop Assessment: no apparent nausea or vomiting Anesthetic complications: no   No notable events documented.  Last Vitals:  Vitals:   12/03/22 1630 12/03/22 1645  BP: 112/72 119/81  Pulse: 92 92  Resp: 14 19  Temp:    SpO2: 100% 95%    Last Pain:  Vitals:   12/03/22 1645  TempSrc:   PainSc: 4                  Carolena Fairbank P Keyari Kleeman

## 2022-12-03 NOTE — Transfer of Care (Signed)
Immediate Anesthesia Transfer of Care Note  Patient: Janet Rivers  Procedure(s) Performed: LEFT OPEN REDUCTION INTERNAL FIXATION (ORIF) ANKLE FRACTURE (Left: Ankle)  Patient Location: PACU  Anesthesia Type:General and Regional  Level of Consciousness: awake, drowsy, and patient cooperative  Airway & Oxygen Therapy: Patient Spontanous Breathing and Patient connected to face mask oxygen  Post-op Assessment: Report given to RN and Post -op Vital signs reviewed and stable  Post vital signs: Reviewed and stable  Last Vitals:  Vitals Value Taken Time  BP    Temp    Pulse 84 12/03/22 1539  Resp 12 12/03/22 1539  SpO2 100 % 12/03/22 1539  Vitals shown include unfiled device data.  Last Pain:  Vitals:   12/03/22 1203  TempSrc: Temporal  PainSc: 4          Complications: No notable events documented.

## 2022-12-03 NOTE — H&P (Signed)
Chief Complaint: Left ankle fracture        History of Present Illness:      Janet Rivers is a 27 y.o. female presenting today for evaluation of a left ankle fracture she sustained 5 days ago.  She states that she tripped over the threshold of the door frame and felt 2 pops in the ankle.  She was seen in the emergency department for evaluation and placed in a splint after x-rays revealed fractures to the distal fibular shaft and medial malleolus.  She has been nonweightbearing to the is taking oxycodone 5 mg as well as Tylenol and naproxen.  Her pain levels are minimal and well-controlled.  Denies any numbness or tingling.     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   Encounter for IUD insertion 04/12/2020   History of postpartum hemorrhage     Previous cesarean delivery, antepartum 12/15/2019   Rh negative state in antepartum period 12/15/2019             Past Surgical History:  Procedure Laterality Date   CESAREAN SECTION       CESAREAN SECTION N/A 02/19/2020    Procedure: CESAREAN SECTION;  Surgeon: Miller Bing, MD;  Location: [facility] LD ORS;  Service: Obstetrics;  Laterality: N/A;   CESAREAN SECTION WITH BILATERAL TUBAL LIGATION N/A 10/11/2022    Procedure: CESAREAN SECTION;  Surgeon: Lennart Pall, MD;  Location: [facility] ORS;  Service: Obstetrics;  Laterality: N/A;   COSMETIC SURGERY   08/2018    was going to have fat taken from belly to buttocks, but heart stopped x4 min with anesthesia, surgery in [place].        Social History         Socioeconomic History   Marital status: Married      Spouse name: Jose   Number of children: 1   Years of education: Not on file   Highest education level: High school graduate  Occupational  History   Not on file  Tobacco Use   Smoking status: Never   Smokeless tobacco: Never  Vaping Use   Vaping status: Never Used  Substance and Sexual Activity   Alcohol use: Not Currently      Comment: occ.    Drug use: No   Sexual activity: Yes      Birth control/protection: None  Other Topics Concern   Not on file  Social History Narrative   Not on file    Social Determinants of Health        Financial Resource Strain: Not on file  Food Insecurity: No Food Insecurity (10/11/2022)    Hunger Vital Sign     Worried About Running Out of Food in the Last Year: Never true     Ran Out of Food in the Last Year: Never true  Recent Concern: Food Insecurity - Food Insecurity Present (07/30/2022)    Hunger Vital Sign     Worried About Running Out of Food in the Last Year: Sometimes true     Ran Out of Food in the Last Year: Never true  Transportation Needs: No Transportation Needs (10/11/2022)    PRAPARE - Therapist, art (Medical): No  Lack of Transportation (Non-Medical): No  Physical Activity: Not on file  Stress: Not on file  Social Connections: Not on file         Family History  Problem Relation Age of Onset   Healthy Mother     Healthy Father     Cancer Paternal Aunt          Allergies  No Known Allergies         Current Outpatient Medications  Medication Sig Dispense Refill   acetaminophen (TYLENOL) 500 MG tablet Take 1 tablet (500 mg total) by mouth every 6 (six) hours as needed for up to 14 days. 30 tablet 0   aspirin EC 325 MG tablet Take 1 tablet (325 mg total) by mouth daily for 15 days. 15 tablet 0   ibuprofen (ADVIL) 800 MG tablet Take 1 tablet (800 mg total) by mouth every 8 (eight) hours as needed for up to 14 days. 30 tablet 0   oxyCODONE (ROXICODONE) 5 MG immediate release tablet Take 1 tablet (5 mg total) by mouth every 4 (four) hours as needed for severe pain or breakthrough pain. 30 tablet 0   acetaminophen (TYLENOL) 325 MG  tablet Take 2 tablets (650 mg total) by mouth every 6 (six) hours as needed.       acetaminophen (TYLENOL) 500 MG tablet Take 2 tablets (1,000 mg total) by mouth every 8 (eight) hours. 180 tablet 0   Blood Pressure Monitoring (BLOOD PRESSURE KIT) DEVI 1 Device by Does not apply route as needed. (Patient not taking: Reported on 08/28/2022) 1 each 0   docusate sodium (COLACE) 100 MG capsule Take 1 capsule (100 mg total) by mouth 2 (two) times daily. (Patient not taking: Reported on 10/23/2022) 10 capsule 0   Misc. Devices (GOJJI WEIGHT SCALE) MISC 1 Device by Does not apply route as needed. (Patient not taking: Reported on 08/28/2022) 1 each 0   naproxen (NAPROSYN) 375 MG tablet Take 1 tablet (375 mg total) by mouth 2 (two) times daily. 20 tablet 0   norethindrone (ORTHO MICRONOR) 0.35 MG tablet Take 1 tablet (0.35 mg total) by mouth daily. 90 tablet 4   Prenatal Vit-Fe Fumarate-FA (PREPLUS) 27-1 MG TABS Take 1 tablet by mouth daily. 30 tablet 13      No current facility-administered medications for this visit.      Imaging Results (Last 48 hours)  No results found.     Review of Systems:   A ROS was performed including pertinent positives and negatives as documented in the HPI.   Physical Exam :   Constitutional: NAD and appears stated age Neurological: Alert and oriented Psych: Appropriate affect and cooperative not currently breastfeeding.    Comprehensive Musculoskeletal Exam:     Patient remains in a short leg splint.  All 5 toes of the left foot are warm and well-perfused with normal capillary refill.  Able to flex and extend all 5 toes.  Distal neurosensory exam intact.   Imaging:   Xray (left ankle 3 views): Oblique fracture of the distal fibular shaft with mild displacement.  Transverse fracture of the medial malleolus with widening of the ankle mortise.       I personally reviewed and interpreted the radiographs.     Assessment:   27 y.o. female with distal fibula and  medial malleolus fractures with associated disruption of the ankle mortise.  After discussion of treatment options including risks and benefits of operative management as well as consultation, patient is agreeable to  proceed with surgical repair with ORIF.   Plan :     -Plan for left ankle open reduction internal fixation    After a lengthy discussion of treatment options, including risks, benefits, alternatives, complications of surgical and nonsurgical conservative options, the patient elected surgical repair.   The patient  is aware of the material risks  and complications including, but not limited to injury to adjacent structures, neurovascular injury, infection, numbness, bleeding, implant failure, thermal burns, stiffness, persistent pain, failure to heal, disease transmission from allograft, need for further surgery, dislocation, anesthetic risks, blood clots, risks of death,and others. The probabilities of surgical success and failure discussed with patient given their particular co-morbidities.The time and nature of expected rehabilitation and recovery was discussed.The patient's questions were all answered preoperatively.  No barriers to understanding were noted. I explained the natural history of the disease process and Rx rationale.  I explained to the patient what I considered to be reasonable expectations given their personal situation.  The final treatment plan was arrived at through a shared patient decision making process model.

## 2022-12-03 NOTE — Anesthesia Procedure Notes (Signed)
Anesthesia Regional Block: Adductor canal block   Pre-Anesthetic Checklist: , timeout performed,  Correct Patient, Correct Site, Correct Laterality,  Correct Procedure, Correct Position, site marked,  Risks and benefits discussed,  Surgical consent,  Pre-op evaluation,  At surgeon's request and post-op pain management  Laterality: Left  Prep: Dura Prep       Needles:  Injection technique: Single-shot  Needle Type: Echogenic Stimulator Needle     Needle Length: 10cm  Needle Gauge: 20     Additional Needles:   Procedures:,,,, ultrasound used (permanent image in chart),,    Narrative:  Start time: 12/03/2022 1:15 PM End time: 12/03/2022 1:17 PM Injection made incrementally with aspirations every 5 mL.  Performed by: Personally  Anesthesiologist: Atilano Median, DO  Additional Notes: Patient identified. Risks/Benefits/Options discussed with patient including but not limited to bleeding, infection, nerve damage, failed block, incomplete pain control. Patient expressed understanding and wished to proceed. All questions were answered. Sterile technique was used throughout the entire procedure. Please see nursing notes for vital signs. Aspirated in 5cc intervals with injection for negative confirmation. Patient was given instructions on fall risk and not to get out of bed. All questions and concerns addressed with instructions to call with any issues or inadequate analgesia.

## 2022-12-03 NOTE — Interval H&P Note (Signed)
History and Physical Interval Note:  12/03/2022 11:39 AM  [name]  has presented today for surgery, with the diagnosis of LEFT DISPLACED ANKLE FRACTURE.  The various methods of treatment have been discussed with the patient and family. After consideration of risks, benefits and other options for treatment, the patient has consented to  Procedure(s): LEFT OPEN REDUCTION INTERNAL FIXATION (ORIF) ANKLE FRACTURE (Left) as a surgical intervention.  The patient's history has been reviewed, patient examined, no change in status, stable for surgery.  I have reviewed the patient's chart and labs.  Questions were answered to the patient's satisfaction.     Huel Cote

## 2022-12-03 NOTE — Progress Notes (Signed)
Assisted Dr. Gavin Potters with left, adductor canal, popliteal, ultrasound guided block. Side rails up, monitors on throughout procedure. See vital signs in flow sheet. Tolerated Procedure well.

## 2022-12-03 NOTE — Anesthesia Procedure Notes (Signed)
Procedure Name: LMA Insertion Date/Time: 12/03/2022 2:15 PM  Performed by: Karen Kitchens, CRNAPre-anesthesia Checklist: Patient identified, Emergency Drugs available, Suction available and Patient being monitored Patient Re-evaluated:Patient Re-evaluated prior to induction Oxygen Delivery Method: Circle system utilized Preoxygenation: Pre-oxygenation with 100% oxygen Induction Type: IV induction Ventilation: Mask ventilation without difficulty LMA: LMA inserted LMA Size: 4.0 Number of attempts: 1 Airway Equipment and Method: Bite block Placement Confirmation: positive ETCO2, CO2 detector and breath sounds checked- equal and bilateral Tube secured with: Tape Dental Injury: Teeth and Oropharynx as per pre-operative assessment

## 2022-12-03 NOTE — Brief Op Note (Signed)
   Brief Op Note  Date of Surgery: 12/03/2022  Preoperative Diagnosis: LEFT DISPLACED ANKLE FRACTURE  Postoperative Diagnosis: same  Procedure: Procedure(s): LEFT OPEN REDUCTION INTERNAL FIXATION (ORIF) ANKLE FRACTURE  Implants: Implant Name Type Inv. Item Serial No. Manufacturer Lot No. LRB No. Used Action  ZIPTIGHT ANKLE SYNODESMOSS FIX - [id number] Ankle ZIPTIGHT ANKLE SYNODESMOSS FIX  ZIMMER RECON(ORTH,TRAU,BIO,SG) [id number] Left 1 Implanted  PLATE 1/3 TUBULAR 6H - [id number] Plate PLATE 1/3 TUBULAR 6H  ZIMMER RECON(ORTH,TRAU,BIO,SG) ON STERILE TRAY Left 1 Implanted  SCREW NON-LOCK 3.5X10 - [id number] Screw SCREW NON-LOCK 3.5X10  ZIMMER RECON(ORTH,TRAU,BIO,SG) ON STERILE TRAY Left 2 Implanted  SCREW NON-LOCK 3.5X12 - [id number] Screw SCREW NON-LOCK 3.5X12  ZIMMER RECON(ORTH,TRAU,BIO,SG) ON STERILE TRAY Left 4 Implanted  SCREW CANN 4.0X50 - [id number] Screw SCREW CANN 4.0X50  ZIMMER RECON(ORTH,TRAU,BIO,SG) ON STERILE TRAY Left 2 Implanted    Surgeons: Surgeon(s): Huel Cote, MD  Anesthesia: General    Estimated Blood Loss: See anesthesia record  Complications: None  Condition to PACU: Stable  Benancio Deeds, MD 12/03/2022 3:26 PM

## 2022-12-03 NOTE — Discharge Instructions (Addendum)
Discharge Instructions    Attending Surgeon: Huel Cote, MD Office Phone Number: [phone]   Diagnosis and Procedures:    Surgeries Performed: Left ankle open reduction internal fixation  Discharge Plan:    Diet: Resume usual diet. Begin with light or bland foods.  Drink plenty of fluids.  Activity:  Non weight bearing for 2 weeks. You are advised to go home directly from the hospital or surgical center. Restrict your activities.  GENERAL INSTRUCTIONS: 1.  Please apply ice to your wound to help with swelling and inflammation. This will improve your comfort and your overall recovery following surgery.     2. Please call Dr. Serena Croissant office at [phone] with questions Monday-Friday during business hours. If no one answers, please leave a message and someone should get back to the patient within 24 hours. For emergencies please call 911 or proceed to the emergency room.   3. Patient to notify surgical team if experiences any of the following: Bowel/Bladder dysfunction, uncontrolled pain, nerve/muscle weakness, incision with increased drainage or redness, nausea/vomiting and Fever greater than 101.0 F.  Be alert for signs of infection including redness, streaking, odor, fever or chills. Be alert for excessive pain or bleeding and notify your surgeon immediately.  WOUND INSTRUCTIONS:   Leave your dressing, cast, or splint in place until your post operative visit.  Keep it clean and dry.  Always keep the incision clean and dry until the staples/sutures are removed. If there is no drainage from the incision you should keep it open to air. If there is drainage from the incision you must keep it covered at all times until the drainage stops  Do not soak in a bath tub, hot tub, pool, lake or other body of water until 21 days after your surgery and your incision is completely dry and healed.  If you have removable sutures (or staples) they must be removed 10-14 days (unless  otherwise instructed) from the day of your surgery.     1)  Elevate the extremity as much as possible.  2)  Keep the dressing clean and dry.  3)  Please call us if the dressing becomes wet or dirty.  4)  If you are experiencing worsening pain or worsening swelling, please call.     MEDICATIONS: Resume all previous home medications at the previous prescribed dose and frequency unless otherwise noted Start taking the  pain medications on an as-needed basis as prescribed  Please taper down pain medication over the next week following surgery.  Ideally you should not require a refill of any narcotic pain medication.  Take pain medication with food to minimize nausea. In addition to the prescribed pain medication, you may take over-the-counter pain relievers such as Tylenol.  Do NOT take additional tylenol if your pain medication already has tylenol in it.  Aspirin 325mg  daily for four weeks.      FOLLOWUP INSTRUCTIONS: 1. Follow up at the Physical Therapy Clinic 3-4 days following surgery. This appointment should be scheduled unless other arrangements have been made.The Physical Therapy scheduling number is [phone] if an appointment has not already been arranged.  2. Contact Dr. Serena Croissant office during office hours at [phone] or the practice after hours line at [phone] for non-emergencies. For medical emergencies call 911.   Discharge Location: Home    Post Anesthesia Home Care Instructions  Activity: Get plenty of rest for the remainder of the day. A responsible individual must stay with you for 24 hours  following the procedure.  For the next 24 hours, DO NOT: -Drive a car -Advertising copywriter -Drink alcoholic beverages -Take any medication unless instructed by your physician -Make any legal decisions or sign important papers.  Meals: Start with liquid foods such as gelatin or soup. Progress to regular foods as tolerated. Avoid greasy, spicy, heavy foods. If nausea  and/or vomiting occur, drink only clear liquids until the nausea and/or vomiting subsides. Call your physician if vomiting continues.  Special Instructions/Symptoms: Your throat may feel dry or sore from the anesthesia or the breathing tube placed in your throat during surgery. If this causes discomfort, gargle with warm salt water. The discomfort should disappear within 24 hours.  If you had a scopolamine patch placed behind your ear for the management of post- operative nausea and/or vomiting:  1. The medication in the patch is effective for 72 hours, after which it should be removed.  Wrap patch in a tissue and discard in the trash. Wash hands thoroughly with soap and water. 2. You may remove the patch earlier than 72 hours if you experience unpleasant side effects which may include dry mouth, dizziness or visual disturbances. 3. Avoid touching the patch. Wash your hands with soap and water after contact with the patch.     Regional Anesthesia Blocks  1. You may not be able to move or feel the "blocked" extremity after a regional anesthetic block. This may last may last from 3-48 hours after placement, but it will go away. The length of time depends on the medication injected and your individual response to the medication. As the nerves start to wake up, you may experience tingling as the movement and feeling returns to your extremity. If the numbness and inability to move your extremity has not gone away after 48 hours, please call your surgeon.   2. The extremity that is blocked will need to be protected until the numbness is gone and the strength has returned. Because you cannot feel it, you will need to take extra care to avoid injury. Because it may be weak, you may have difficulty moving it or using it. You may not know what position it is in without looking at it while the block is in effect.  3. For blocks in the legs and feet, returning to weight bearing and walking needs to be done  carefully. You will need to wait until the numbness is entirely gone and the strength has returned. You should be able to move your leg and foot normally before you try and bear weight or walk. You will need someone to be with you when you first try to ensure you do not fall and possibly risk injury.  4. Bruising and tenderness at the needle site are common side effects and will resolve in a few days.  5. Persistent numbness or new problems with movement should be communicated to the surgeon or the [facility] [phone] [facility] [phone]).  *May have Tylenol today at 6pm *May have Ibuprofen today at 9:30pm

## 2022-12-03 NOTE — Anesthesia Preprocedure Evaluation (Signed)
Anesthesia Evaluation  Patient identified by MRN, date of birth, ID band Patient awake    Reviewed: Allergy & Precautions, NPO status , Patient's Chart, lab work & pertinent test results  History of Anesthesia Complications (+) history of anesthetic complications (cardiac arrest with plastic surgery in [place], thinks 2/2 overdose)  Airway Mallampati: II  TM Distance: >3 FB Neck ROM: Full    Dental no notable dental hx.    Pulmonary neg pulmonary ROS   Pulmonary exam normal        Cardiovascular negative cardio ROS  Rhythm:Regular Rate:Normal     Neuro/Psych negative neurological ROS  negative psych ROS   GI/Hepatic negative GI ROS, Neg liver ROS,,,  Endo/Other  negative endocrine ROS    Renal/GU negative Renal ROS  negative genitourinary   Musculoskeletal Ankle fx   Abdominal Normal abdominal exam  (+)   Peds  Hematology Lab Results      Component                Value               Date                      WBC                      11.6 (H)            10/12/2022                HGB                      9.1 (L)             10/12/2022                HCT                      26.8 (L)            10/12/2022                MCV                      94.0                10/12/2022                PLT                      156                 10/12/2022              Anesthesia Other Findings   Reproductive/Obstetrics Lab Results      Component                Value               Date                      PREGTESTUR               NEGATIVE            12/03/2022                HCG                      >  2,000.0 (H)        05/20/2019                HCGQUANT                 20                  06/16/2019                                        Anesthesia Physical Anesthesia Plan  ASA: 2  Anesthesia Plan: General and Regional   Post-op Pain Management: Regional block*, Tylenol PO (pre-op)* and  Gabapentin PO (pre-op)*   Induction: Intravenous  PONV Risk Score and Plan: 3 and Ondansetron, Dexamethasone, Midazolam and Treatment may vary due to age or medical condition  Airway Management Planned: Mask and LMA  Additional Equipment: None  Intra-op Plan:   Post-operative Plan: Extubation in OR  Informed Consent: I have reviewed the patients History and Physical, chart, labs and discussed the procedure including the risks, benefits and alternatives for the proposed anesthesia with the patient or authorized representative who has indicated his/her understanding and acceptance.     Dental advisory given and Interpreter used for interveiw  Plan Discussed with: CRNA  Anesthesia Plan Comments:        Anesthesia Quick Evaluation

## 2022-12-04 ENCOUNTER — Telehealth (HOSPITAL_BASED_OUTPATIENT_CLINIC_OR_DEPARTMENT_OTHER): Payer: Self-pay | Admitting: Orthopaedic Surgery

## 2022-12-04 NOTE — Telephone Encounter (Signed)
Patient needs an order to get a wheelchair or walker. Best contact number [phone]

## 2022-12-04 NOTE — Telephone Encounter (Signed)
Ordered walker via parachute and called patient to confirm

## 2022-12-06 DIAGNOSIS — S82432A Displaced oblique fracture of shaft of left fibula, initial encounter for closed fracture: Secondary | ICD-10-CM | POA: Diagnosis not present

## 2022-12-09 ENCOUNTER — Encounter (HOSPITAL_BASED_OUTPATIENT_CLINIC_OR_DEPARTMENT_OTHER): Payer: Self-pay | Admitting: Orthopaedic Surgery

## 2022-12-13 ENCOUNTER — Encounter (HOSPITAL_BASED_OUTPATIENT_CLINIC_OR_DEPARTMENT_OTHER): Payer: Medicaid Other | Admitting: Orthopaedic Surgery

## 2022-12-16 ENCOUNTER — Ambulatory Visit (INDEPENDENT_AMBULATORY_CARE_PROVIDER_SITE_OTHER): Payer: Medicaid Other | Admitting: Orthopaedic Surgery

## 2022-12-16 ENCOUNTER — Ambulatory Visit (INDEPENDENT_AMBULATORY_CARE_PROVIDER_SITE_OTHER): Payer: Medicaid Other

## 2022-12-16 DIAGNOSIS — R609 Edema, unspecified: Secondary | ICD-10-CM | POA: Diagnosis not present

## 2022-12-16 DIAGNOSIS — S82402D Unspecified fracture of shaft of left fibula, subsequent encounter for closed fracture with routine healing: Secondary | ICD-10-CM | POA: Diagnosis not present

## 2022-12-16 DIAGNOSIS — S8252XD Displaced fracture of medial malleolus of left tibia, subsequent encounter for closed fracture with routine healing: Secondary | ICD-10-CM | POA: Diagnosis not present

## 2022-12-16 DIAGNOSIS — S82432A Displaced oblique fracture of shaft of left fibula, initial encounter for closed fracture: Secondary | ICD-10-CM | POA: Diagnosis not present

## 2022-12-16 DIAGNOSIS — Z9889 Other specified postprocedural states: Secondary | ICD-10-CM | POA: Diagnosis not present

## 2022-12-16 NOTE — Progress Notes (Signed)
Post Operative Evaluation    Procedure/Date of Surgery: Left ankle open reduction internal fixation 9/17  Interval History:    Presents today 2 weeks status post the above procedure.  Overall she is doing very well.  She has begun to place weight on the left ankle.  She has not been enrolled with physical therapy as time.  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   Encounter for IUD insertion 04/12/2020   History of postpartum hemorrhage    Previous cesarean delivery, antepartum 12/15/2019   Rh negative state in antepartum period 12/15/2019   Past Surgical History:  Procedure Laterality Date   CESAREAN SECTION     CESAREAN SECTION N/A 02/19/2020   Procedure: CESAREAN SECTION;  Surgeon: La Puente Bing, MD;  Location: [facility];  Service: Obstetrics;  Laterality: N/A;   CESAREAN SECTION WITH BILATERAL TUBAL LIGATION N/A 10/11/2022   Procedure: CESAREAN SECTION;  Surgeon: Lennart Pall, MD;  Location: [facility];  Service: Obstetrics;  Laterality: N/A;   COSMETIC SURGERY  08/2018   was going to have fat taken from belly to buttocks, but heart stopped x4 min with anesthesia, surgery in [place].   ORIF ANKLE FRACTURE Left 12/03/2022   Procedure: LEFT OPEN REDUCTION INTERNAL FIXATION (ORIF) ANKLE FRACTURE;  Surgeon: Huel Cote, MD;  Location: [facility];  Service: Orthopedics;  Laterality: Left;   Social History   Socioeconomic History   Marital status: Married    Spouse name: Jose   Number of children: 1   Years of education: Not on file   Highest education level: High school graduate  Occupational History   Not on file  Tobacco Use   Smoking status: Never   Smokeless tobacco: Never  Vaping Use    Vaping status: Never Used  Substance and Sexual Activity   Alcohol use: Not Currently    Comment: occ.    Drug use: No   Sexual activity: Yes    Birth control/protection: None  Other Topics Concern   Not on file  Social History Narrative   Not on file   Social Determinants of Health   Financial Resource Strain: Not on file  Food Insecurity: No Food Insecurity (10/11/2022)   Hunger Vital Sign    Worried About Running Out of Food in the Last Year: Never true    Ran Out of Food in the Last Year: Never true  Recent Concern: Food Insecurity - Food Insecurity Present (07/30/2022)   Hunger Vital Sign    Worried About Running Out of Food in the Last Year: Sometimes true    Ran Out of Food in the Last Year: Never true  Transportation Needs: No Transportation Needs (10/11/2022)   PRAPARE - Administrator, Civil Service (Medical): No    Lack of Transportation (Non-Medical): No  Physical Activity: Not on file  Stress: Not on file  Social Connections: Not on file   Family History  Problem Relation Age of Onset   Healthy Mother    Healthy Father    Cancer Paternal Aunt    No Known Allergies Current Outpatient  Medications  Medication Sig Dispense Refill   acetaminophen (TYLENOL) 500 MG tablet Take 2 tablets (1,000 mg total) by mouth every 8 (eight) hours. 180 tablet 0   naproxen (NAPROSYN) 375 MG tablet Take 1 tablet (375 mg total) by mouth 2 (two) times daily. 20 tablet 0   norethindrone (ORTHO MICRONOR) 0.35 MG tablet Take 1 tablet (0.35 mg total) by mouth daily. 90 tablet 4   oxyCODONE (ROXICODONE) 5 MG immediate release tablet Take 1 tablet (5 mg total) by mouth every 4 (four) hours as needed for severe pain or breakthrough pain. 30 tablet 0   Prenatal Vit-Fe Fumarate-FA (PREPLUS) 27-1 MG TABS Take 1 tablet by mouth daily. 30 tablet 13   No current facility-administered medications for this visit.   No results found.  Review of Systems:   A ROS was performed including  pertinent positives and negatives as documented in the HPI.   Musculoskeletal Exam:    Last menstrual period 11/29/2022, not currently breastfeeding.  Left ankle incisions are well-appearing without erythema or drainage.  Trace swelling about the ankle.  20 degrees dorsi and plantarflexion about the ankle.  2+ dorsalis pedis pulse  Imaging:    3 views left ankle: Status post open reduction internal fixation without evidence of hardware failure  I personally reviewed and interpreted the radiographs.   Assessment:   2-week status post left ankle open reduction internal fixation overall doing very well.  This time will begin physical therapy.  She will advance weightbearing as tolerated.  I would like to wean out of her cam boot over the course the next 2 weeks.  Plan :    -Return up in 4 weeks for reassessment      I personally saw and evaluated the patient, and participated in the management and treatment plan.  Huel Cote, MD Attending Physician, Orthopedic Surgery  This document was dictated using Dragon voice recognition software. A reasonable attempt at proof reading has been made to minimize errors.

## 2022-12-21 ENCOUNTER — Ambulatory Visit (HOSPITAL_BASED_OUTPATIENT_CLINIC_OR_DEPARTMENT_OTHER): Payer: Medicaid Other | Attending: Orthopaedic Surgery | Admitting: Physical Therapy

## 2022-12-21 DIAGNOSIS — M25572 Pain in left ankle and joints of left foot: Secondary | ICD-10-CM | POA: Insufficient documentation

## 2022-12-21 DIAGNOSIS — M6281 Muscle weakness (generalized): Secondary | ICD-10-CM | POA: Insufficient documentation

## 2022-12-21 DIAGNOSIS — X58XXXA Exposure to other specified factors, initial encounter: Secondary | ICD-10-CM | POA: Insufficient documentation

## 2022-12-21 DIAGNOSIS — R29898 Other symptoms and signs involving the musculoskeletal system: Secondary | ICD-10-CM | POA: Insufficient documentation

## 2022-12-21 DIAGNOSIS — R2689 Other abnormalities of gait and mobility: Secondary | ICD-10-CM | POA: Insufficient documentation

## 2022-12-21 DIAGNOSIS — M25672 Stiffness of left ankle, not elsewhere classified: Secondary | ICD-10-CM | POA: Insufficient documentation

## 2022-12-21 DIAGNOSIS — S82432A Displaced oblique fracture of shaft of left fibula, initial encounter for closed fracture: Secondary | ICD-10-CM | POA: Insufficient documentation

## 2022-12-24 ENCOUNTER — Ambulatory Visit (HOSPITAL_BASED_OUTPATIENT_CLINIC_OR_DEPARTMENT_OTHER): Payer: Medicaid Other | Admitting: Physical Therapy

## 2022-12-24 ENCOUNTER — Encounter (HOSPITAL_BASED_OUTPATIENT_CLINIC_OR_DEPARTMENT_OTHER): Payer: Self-pay | Admitting: Physical Therapy

## 2022-12-24 ENCOUNTER — Other Ambulatory Visit: Payer: Self-pay

## 2022-12-24 DIAGNOSIS — X58XXXA Exposure to other specified factors, initial encounter: Secondary | ICD-10-CM | POA: Diagnosis not present

## 2022-12-24 DIAGNOSIS — M6281 Muscle weakness (generalized): Secondary | ICD-10-CM | POA: Diagnosis not present

## 2022-12-24 DIAGNOSIS — R2689 Other abnormalities of gait and mobility: Secondary | ICD-10-CM | POA: Diagnosis not present

## 2022-12-24 DIAGNOSIS — R29898 Other symptoms and signs involving the musculoskeletal system: Secondary | ICD-10-CM | POA: Diagnosis not present

## 2022-12-24 DIAGNOSIS — S82432A Displaced oblique fracture of shaft of left fibula, initial encounter for closed fracture: Secondary | ICD-10-CM | POA: Diagnosis not present

## 2022-12-24 DIAGNOSIS — M25572 Pain in left ankle and joints of left foot: Secondary | ICD-10-CM | POA: Diagnosis not present

## 2022-12-24 DIAGNOSIS — M25672 Stiffness of left ankle, not elsewhere classified: Secondary | ICD-10-CM

## 2022-12-24 NOTE — Therapy (Signed)
OUTPATIENT PHYSICAL THERAPY LOWER EXTREMITY EVALUATION   Patient Name: Janet Rivers MRN: [id number] DOB:03/16/1996, 27 y.o., female Today's Date: 12/24/2022  END OF SESSION:  PT End of Session - 12/24/22 0858     Visit Number 1    Number of Visits 12    Date for PT Re-Evaluation 02/04/23    Authorization Type Medicaid healthy blue    Authorization - Visit Number 1    Authorization - Number of Visits 27    PT Start Time [phone]   arrives late/delayed check in   PT Stop Time 0928    PT Time Calculation (min) 30 min    Activity Tolerance Patient tolerated treatment well    Behavior During Therapy [facility] for tasks assessed/performed             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   Encounter for IUD insertion 04/12/2020   History of postpartum hemorrhage    Previous cesarean delivery, antepartum 12/15/2019   Rh negative state in antepartum period 12/15/2019   Past Surgical History:  Procedure Laterality Date   CESAREAN SECTION     CESAREAN SECTION N/A 02/19/2020   Procedure: CESAREAN SECTION;  Surgeon: Greenlee Bing, MD;  Location: [facility] LD ORS;  Service: Obstetrics;  Laterality: N/A;   CESAREAN SECTION WITH BILATERAL TUBAL LIGATION N/A 10/11/2022   Procedure: CESAREAN SECTION;  Surgeon: Lennart Pall, MD;  Location: [facility] ORS;  Service: Obstetrics;  Laterality: N/A;   COSMETIC SURGERY  08/2018   was going to have fat taken from belly to buttocks, but heart stopped x4 min with anesthesia, surgery in [place].   ORIF ANKLE FRACTURE Left 12/03/2022   Procedure: LEFT OPEN REDUCTION INTERNAL FIXATION (ORIF) ANKLE FRACTURE;  Surgeon: Huel Cote, MD;  Location: [facility];  Service: Orthopedics;  Laterality: Left;   Patient Active Problem List   Diagnosis Date Noted   Closed trimalleolar fracture of left ankle 12/03/2022    Maternal care due to low transverse uterine scar from previous cesarean delivery 10/11/2022   Encounter for cesarean delivery without indication 10/08/2022   Rubella non-immune status, antepartum 02/21/2020   Status post repeat low transverse cesarean section 02/19/2020   History of anesthesia reaction 01/29/2020   Rh negative state in antepartum period, first trimester 12/15/2019   History of genital warts 10/06/2019    PCP: No PCP  REFERRING PROVIDER: Huel Cote, MD  REFERRING DIAG: S82.432A (ICD-10-CM) - Closed displaced oblique fracture of shaft of left fibula, initial encounter;  ankle ORIF s/p Displaced left trimalleolar ankle fracture  THERAPY DIAG:  Pain in left ankle and joints of left foot  Stiffness of left ankle, not elsewhere classified  Muscle weakness (generalized)  Other abnormalities of gait and mobility  Other symptoms and signs involving the musculoskeletal system  Rationale for Evaluation and Treatment: Rehabilitation  ONSET DATE: DOS 12/03/22  SUBJECTIVE:   SUBJECTIVE STATEMENT: Patient states ankle has been doing pretty good. Not walking normal without the boot yet when trying at home. Broke ankle when stepping on a loose threshold.   PERTINENT HISTORY: Recent c-section 10/11/22 PAIN:  Are you having pain? No current  PRECAUTIONS: None  WEIGHT BEARING RESTRICTIONS:  WBAT, wean boot per last MD visit  FALLS:  Has patient fallen in last 6 months? Yes. Number of falls 1  OCCUPATION: not currently employed   PLOF: Independent  PATIENT  GOALS: get off the boot and walk normal   OBJECTIVE: (objective measures from initial evaluation unless otherwise dated) Observation: healing medial and lateral ankle incisions   DIAGNOSTIC FINDINGS: XR 12/16/22: IMPRESSION:ORIF of distal fibular and medial malleolar fractures without complication.  PATIENT SURVEYS:  LEFS 38/80  COGNITION: Overall cognitive status: Within functional limits for tasks  assessed     SENSATION: WFL   POSTURE: No Significant postural limitations  PALPATION: Grossly TTP throughout L ankle   LOWER EXTREMITY ROM:  Active ROM Right eval Left eval  Hip flexion    Hip extension    Hip abduction    Hip adduction    Hip internal rotation    Hip external rotation    Knee flexion    Knee extension    Ankle dorsiflexion 6 Lacking 4  Ankle plantarflexion 31 31  Ankle inversion 30 22  Ankle eversion 8 5   (Blank rows = not tested) *= pain/symptoms  LOWER EXTREMITY MMT:  MMT Right eval Left eval  Hip flexion 5 4+  Hip extension    Hip abduction    Hip adduction    Hip internal rotation    Hip external rotation    Knee flexion 5 4 *  Knee extension 5 4+ *  Ankle dorsiflexion 5 4+*  Ankle plantarflexion    Ankle inversion 5 4*  Ankle eversion 5 4*   (Blank rows = not tested) *= pain/symptoms   FUNCTIONAL TESTS:  5 times sit to stand: 15.62 seconds without UE use and no boot  GAIT: Distance walked: 5 feet Assistive device utilized: None Level of assistance: Modified independence Comments: antalgic on LLE without boot, limited ankle mobility   TODAY'S TREATMENT:                                                                                                                              DATE:  12/24/22 EVAL and HEP    PATIENT EDUCATION:  Education details: Patient educated on exam findings, POC, scope of PT, HEP, and surgical procedure performed and healing timelines. Person educated: Patient Education method: Explanation, Demonstration, and Handouts Education comprehension: verbalized understanding, returned demonstration, verbal cues required, and tactile cues required  HOME EXERCISE PROGRAM: Access Code: [email or web address] URL: [email or web address]  Date: 12/24/2022 - Seated Ankle Pumps  - 3 x daily - 7 x weekly - 3 sets - 10 reps - Seated Ankle Circles  - 3 x daily - 7 x weekly - 3 sets - 10 reps - Seated Heel Toe  Raises  - 3 x daily - 7 x weekly - 3 sets - 10 reps - Seated Heel Slide (Mirrored)  - 3 x daily - 7 x weekly - 10 reps - 10 second hold - Ankle Inversion Eversion Towel Slide  - 3 x daily - 7 x weekly - 3 sets - 10 reps  ASSESSMENT:  CLINICAL IMPRESSION: Patient a 27 y.o. y.o. female who was seen today for  physical therapy evaluation and treatment for s/p L ankle ORIF on 12/03/22. Patient presents with pain limited deficits in L ankle strength, ROM, endurance, activity tolerance, gait, balance, and functional mobility with ADL. Patient is having to modify and restrict ADL as indicated by outcome measure score as well as subjective information and objective measures which is affecting overall participation. Patient will benefit from skilled physical therapy in order to improve function and reduce impairment.  OBJECTIVE IMPAIRMENTS: Abnormal gait, decreased activity tolerance, decreased balance, decreased endurance, decreased mobility, difficulty walking, decreased ROM, decreased strength, increased edema, impaired flexibility, improper body mechanics, and pain.   ACTIVITY LIMITATIONS: carrying, lifting, bending, standing, squatting, stairs, transfers, locomotion level, and caring for others  PARTICIPATION LIMITATIONS: meal prep, cleaning, laundry, shopping, community activity, occupation, and yard work  PERSONAL FACTORS:  c-section 10/11/22  are also affecting patient's functional outcome.   REHAB POTENTIAL: Good  CLINICAL DECISION MAKING: Stable/uncomplicated  EVALUATION COMPLEXITY: Low   GOALS: Goals reviewed with patient? Yes  SHORT TERM GOALS: Target date: 01/14/2023    Patient will be independent with HEP in order to improve functional outcomes. Baseline: Goal status: INITIAL  2.  Patient will report at least 25% improvement in symptoms for improved quality of life. Baseline: Goal status: INITIAL    LONG TERM GOALS: Target date: 02/04/2023    Patient will report at least  75% improvement in symptoms for improved quality of life. Baseline:  Goal status: INITIAL  2.  Patient will improve LEFS score by at least 9 points in order to indicate improved tolerance to activity. Baseline: 38/80 Goal status: INITIAL  3.  Patient will be able to navigate stairs with reciprocal pattern without compensation in order to demonstrate improved LE strength. Baseline:  Goal status: INITIAL  4.  Patient will demonstrate at least 10 degrees of L ankle DF for improved gait and stair mechanics. Baseline: lacking 4 Goal status: INITIAL  5.  Patient will be able to complete 5x STS in under 11.4 seconds in order to reduce the risk of falls. Baseline: 15.62 seconds without UE use and no boot Goal status: INITIAL  6.  Patient will demonstrate grade of 5/5 MMT grade in all tested musculature as evidence of improved strength to assist with stair ambulation and gait.   Baseline: see above Goal status: INITIAL   PLAN:  PT FREQUENCY: 2x/week  PT DURATION: 6 weeks  PLANNED INTERVENTIONS: Therapeutic exercises, Therapeutic activity, Neuromuscular re-education, Balance training, Gait training, Patient/Family education, Joint manipulation, Joint mobilization, Stair training, Orthotic/Fit training, DME instructions, Aquatic Therapy, Dry Needling, Electrical stimulation, Spinal manipulation, Spinal mobilization, Cryotherapy, Moist heat, Compression bandaging, scar mobilization, Splintting, Taping, Traction, Ultrasound, Ionotophoresis 4mg /ml Dexamethasone, and Manual therapy  PLAN FOR NEXT SESSION: f/u with HEP, ankle mobility and strength, consider weight shifts without boot, standing exercises, etc. And progress as tolerated   Janet Rivers, PT 12/24/2022, 9:28 AM  Check all possible CPT codes: 78295 - PT Re-evaluation, 97110- Therapeutic Exercise, [phone]- Neuro Re-education, [phone] - Gait Training, [phone] - Manual Therapy, [phone] - Therapeutic Activities, [phone] - Self Care, and  [phone] - Aquatic therapy    Check all conditions that are expected to impact treatment: {Conditions expected to impact treatment:Current pregnancy or recent postpartum   If treatment provided at initial evaluation, no treatment charged due to lack of authorization.

## 2022-12-31 ENCOUNTER — Ambulatory Visit (INDEPENDENT_AMBULATORY_CARE_PROVIDER_SITE_OTHER): Payer: Medicaid Other | Admitting: Family Medicine

## 2022-12-31 ENCOUNTER — Other Ambulatory Visit: Payer: Self-pay

## 2022-12-31 ENCOUNTER — Encounter: Payer: Self-pay | Admitting: Family Medicine

## 2022-12-31 DIAGNOSIS — Z6791 Unspecified blood type, Rh negative: Secondary | ICD-10-CM

## 2022-12-31 DIAGNOSIS — Z2839 Other underimmunization status: Secondary | ICD-10-CM

## 2022-12-31 DIAGNOSIS — Z30013 Encounter for initial prescription of injectable contraceptive: Secondary | ICD-10-CM | POA: Diagnosis not present

## 2022-12-31 DIAGNOSIS — S82852D Displaced trimalleolar fracture of left lower leg, subsequent encounter for closed fracture with routine healing: Secondary | ICD-10-CM

## 2022-12-31 MED ORDER — MEDROXYPROGESTERONE ACETATE 150 MG/ML IM SUSY
150.0000 mg | PREFILLED_SYRINGE | Freq: Once | INTRAMUSCULAR | Status: AC
Start: 2022-12-31 — End: 2022-12-31
  Administered 2022-12-31: 150 mg via INTRAMUSCULAR

## 2022-12-31 NOTE — Progress Notes (Signed)
Post Partum Visit Note  Janet Rivers is a 27 y.o. [phone] female who presents for a postpartum visit. She is  11   weeks postpartum following a repeat cesarean section.  I have fully reviewed the prenatal and intrapartum course. The delivery was at 39 gestational weeks.  Anesthesia: spinal. Postpartum course has been uneventful from OB perspective but did suffer ankle fracture requiring surgery on 12/03/22. Baby is doing well. Baby is feeding by bottle - Similac . Bleeding no bleeding. Bowel function is normal. Bladder function is normal. Patient is sexually active. Contraception method is OCP (estrogen/progesterone). Postpartum depression screening: negative.   Upstream - 12/31/22 1122       Pregnancy Intention Screening   Does the patient want to become pregnant in the next year? No    Does the patient's partner want to become pregnant in the next year? Yes    Would the patient like to discuss contraceptive options today? No      Contraception Wrap Up   Current Method Oral Contraceptive    End Method Oral Contraceptive    Contraception Counseling Provided Yes            The pregnancy intention screening data noted above was reviewed. Potential methods of contraception were discussed. The patient elected to proceed with Oral Contraceptive.   Edinburgh Postnatal Depression Scale - 12/31/22 1121       Edinburgh Postnatal Depression Scale:  In the Past 7 Days   I have been able to laugh and see the funny side of things. 0    I have looked forward with enjoyment to things. 0    I have blamed myself unnecessarily when things went wrong. 0    I have been anxious or worried for no good reason. 0    I have felt scared or panicky for no good reason. 0    Things have been getting on top of me. 0    I have been so unhappy that I have had difficulty sleeping. 0    I have felt sad or miserable. 0    I have been so unhappy that I have been crying. 0    The thought of harming  myself has occurred to me. 0    Edinburgh Postnatal Depression Scale Total 0             Health Maintenance Due  Topic Date Due   INFLUENZA VACCINE  10/17/2022   Cervical Cancer Screening (Pap smear)  10/21/2022   COVID-19 Vaccine (1 - 2023-24 season) Never done    The following portions of the patient's history were reviewed and updated as appropriate: allergies, current medications, past family history, past medical history, past social history, past surgical history, and problem list.  Review of Systems Pertinent items noted in HPI and remainder of comprehensive ROS otherwise negative.  Objective:  BP 104/74   Pulse 99   Wt 136 lb (61.7 kg)   LMP 11/29/2022 (Exact Date)   BMI 23.34 kg/m    General:  alert, cooperative, and appears stated age   Breasts:  not indicated  Lungs: Comfortalbe on room air  Wound well approximated incision  GU exam:  not indicated        Assessment:   Postpartum exam  Rubella non-immune status, antepartum  Rh negative state in antepartum period, first trimester  Closed trimalleolar fracture of left ankle with routine healing, subsequent encounter  Normal postpartum exam.   Plan:   Essential components of  care per ACOG recommendations:  1.  Mood and well being: Patient with negative depression screening today. Reviewed local resources for support.  - Patient tobacco use? No.   - hx of drug use? No.    2. Infant care and feeding:  -Patient currently breastmilk feeding? No.  -Social determinants of health (SDOH) reviewed in EPIC. No concerns  3. Sexuality, contraception and birth spacing - Patient does not want a pregnancy in the next year.  Desired family size is 3 children.  - Reviewed reproductive life planning. Reviewed contraceptive methods based on pt preferences and effectiveness.  Patient had been using micronor, but switched to Depo today.   - Discussed birth spacing of 18 months  4. Sleep and fatigue -Encouraged  family/partner/community support of 4 hrs of uninterrupted sleep to help with mood and fatigue  5. Physical Recovery  - Discussed patients delivery and complications. She describes her labor as good. - Patient had a C-section repeat; no problems after deliver.  - Patient has urinary incontinence? No. - Patient is safe to resume physical and sexual activity  6.  Health Maintenance - HM due items addressed Yes - Last pap smear  Diagnosis  Date Value Ref Range Status  10/21/2019   Final   - Negative for intraepithelial lesion or malignancy (NILM)   Pap smear not done at today's visit, would like to reschedule, will do so at checkout.  -Breast Cancer screening indicated? No.   7. Chronic Disease/Pregnancy Condition follow up:  see below Rh neg: confirmed infant was also rh neg, did not receive rhogam (this was appropriate) Rubella non immune: recommend MMR vaccine at [facility] Ankle fracture: still in boot for four more weeks but doing well, following w Ortho  - PCP follow up  Venora Maples, MD Center for [facility], [facility]

## 2022-12-31 NOTE — Progress Notes (Deleted)
    Post Partum Visit Note  Janet Rivers is a 27 y.o. [phone] female who presents for a postpartum visit. She is  11  weeks postpartum following a repeat cesarean section.  I have fully reviewed the prenatal and intrapartum course. The delivery was at [redacted]w[redacted]d gestational weeks.  Anesthesia: general and regional. Postpartum course has been ***. Baby is doing well***. Baby is feeding by {breastmilk/bottle:69}. Bleeding {vag bleed:12292}. Bowel function is {normal:32111}. Bladder function is {normal:32111}. Patient {is/is not:9024} sexually active. Contraception method is {contraceptive method:5051}. Postpartum depression screening: {gen negative/positive:315881}.   The pregnancy intention screening data noted above was reviewed. Potential methods of contraception were discussed. The patient elected to proceed with No data recorded.    Health Maintenance Due  Topic Date Due   INFLUENZA VACCINE  10/17/2022   Cervical Cancer Screening (Pap smear)  10/21/2022   COVID-19 Vaccine (1 - 2023-24 season) Never done    {Common ambulatory SmartLinks:19316}  Review of Systems {ros; complete:30496}  Objective:  LMP 11/29/2022 (Exact Date)    General:  {gen appearance:16600}   Breasts:  {desc; normal/abnormal/not indicated:14647}  Lungs: {lung exam:16931}  Heart:  {heart exam:5510}  Abdomen: {abdomen exam:16834}   Wound {Wound assessment:11097}  GU exam:  {desc; normal/abnormal/not indicated:14647}       Assessment:    There are no diagnoses linked to this encounter.  *** postpartum exam.   Plan:   Essential components of care per ACOG recommendations:  1.  Mood and well being: Patient with {gen negative/positive:315881} depression screening today. Reviewed local resources for support.  - Patient tobacco use? {tobacco use:25506}  - hx of drug use? {yes/no:25505}    2. Infant care and feeding:  -Patient currently breastmilk feeding? {yes/no:25502}  -Social determinants of  health (SDOH) reviewed in EPIC. No concerns***The following needs were identified***  3. Sexuality, contraception and birth spacing - Patient {DOES_DOES VHQ:46962} want a pregnancy in the next year.  Desired family size is {NUMBER 1-10:22536} children.  - Reviewed reproductive life planning. Reviewed contraceptive methods based on pt preferences and effectiveness.  Patient desired {Upstream End Methods:24109} today.   - Discussed birth spacing of 18 months  4. Sleep and fatigue -Encouraged family/partner/community support of 4 hrs of uninterrupted sleep to help with mood and fatigue  5. Physical Recovery  - Discussed patients delivery and complications. She describes her labor as {description:25511} - Patient had a {CHL AMB [phone]}. Patient had a {laceration:25518} laceration. Perineal healing reviewed. Patient expressed understanding - Patient has urinary incontinence? {yes/no:25515} - Patient {ACTION; IS/IS XBM:[id number]} safe to resume physical and sexual activity  6.  Health Maintenance - HM due items addressed {Yes or If no, why not?:20788} - Last pap smear  Diagnosis  Date Value Ref Range Status  10/21/2019   Final   - Negative for intraepithelial lesion or malignancy (NILM)   Pap smear {done:10129} at today's visit.  -Breast Cancer screening indicated? {indicated:25516}  7. Chronic Disease/Pregnancy Condition follow up: {Follow up:25499}  - PCP follow up  Vidal Schwalbe, CMA Center for [employer], [facility]

## 2022-12-31 NOTE — Addendum Note (Signed)
Addended by: Maxwell Marion E on: 12/31/2022 02:00 PM   Modules accepted: Orders

## 2023-01-01 LAB — POCT PREGNANCY, URINE: Preg Test, Ur: NEGATIVE

## 2023-01-04 ENCOUNTER — Encounter (HOSPITAL_BASED_OUTPATIENT_CLINIC_OR_DEPARTMENT_OTHER): Payer: Self-pay | Admitting: Physical Therapy

## 2023-01-04 ENCOUNTER — Ambulatory Visit (HOSPITAL_BASED_OUTPATIENT_CLINIC_OR_DEPARTMENT_OTHER): Payer: Medicaid Other | Admitting: Physical Therapy

## 2023-01-04 DIAGNOSIS — R2689 Other abnormalities of gait and mobility: Secondary | ICD-10-CM

## 2023-01-04 DIAGNOSIS — R29898 Other symptoms and signs involving the musculoskeletal system: Secondary | ICD-10-CM | POA: Diagnosis not present

## 2023-01-04 DIAGNOSIS — M6281 Muscle weakness (generalized): Secondary | ICD-10-CM | POA: Diagnosis not present

## 2023-01-04 DIAGNOSIS — S82432A Displaced oblique fracture of shaft of left fibula, initial encounter for closed fracture: Secondary | ICD-10-CM | POA: Diagnosis not present

## 2023-01-04 DIAGNOSIS — M25672 Stiffness of left ankle, not elsewhere classified: Secondary | ICD-10-CM

## 2023-01-04 DIAGNOSIS — M25572 Pain in left ankle and joints of left foot: Secondary | ICD-10-CM | POA: Diagnosis not present

## 2023-01-04 NOTE — Therapy (Signed)
OUTPATIENT PHYSICAL THERAPY LOWER EXTREMITY TREATMENT   Patient Name: Janet Rivers MRN: [id number] DOB:Jul 24, 1995, 27 y.o., female Today's Date: 01/04/2023  END OF SESSION:  PT End of Session - 01/04/23 0928     Visit Number 2    Number of Visits 12    Date for PT Re-Evaluation 02/04/23    Authorization Type Medicaid healthy blue    Authorization - Visit Number 2    Authorization - Number of Visits 27    PT Start Time [phone]    PT Stop Time 1003    PT Time Calculation (min) 38 min    Behavior During Therapy WFL for tasks assessed/performed             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   Encounter for IUD insertion 04/12/2020   History of postpartum hemorrhage    Previous cesarean delivery, antepartum 12/15/2019   Rh negative state in antepartum period 12/15/2019   Past Surgical History:  Procedure Laterality Date   CESAREAN SECTION     CESAREAN SECTION N/A 02/19/2020   Procedure: CESAREAN SECTION;  Surgeon: [place] Bing, MD;  Location: [facility];  Service: Obstetrics;  Laterality: N/A;   CESAREAN SECTION WITH BILATERAL TUBAL LIGATION N/A 10/11/2022   Procedure: CESAREAN SECTION;  Surgeon: Lennart Pall, MD;  Location: [facility];  Service: Obstetrics;  Laterality: N/A;   COSMETIC SURGERY  08/2018   was going to have fat taken from belly to buttocks, but heart stopped x4 min with anesthesia, surgery in [place].   ORIF ANKLE FRACTURE Left 12/03/2022   Procedure: LEFT OPEN REDUCTION INTERNAL FIXATION (ORIF) ANKLE FRACTURE;  Surgeon: Huel Cote, MD;  Location: [facility];  Service: Orthopedics;  Laterality: Left;   Patient Active Problem List   Diagnosis Date Noted   Closed trimalleolar fracture of left ankle 12/03/2022   Rubella non-immune status, antepartum 02/21/2020   History of 3 cesarean sections  02/19/2020   History of anesthesia reaction 01/29/2020   Rh negative state in antepartum period, first trimester 12/15/2019   History of genital warts 10/06/2019    PCP: No PCP  REFERRING PROVIDER: Huel Cote, MD  REFERRING DIAG: S82.432A (ICD-10-CM) - Closed displaced oblique fracture of shaft of left fibula, initial encounter;  ankle ORIF s/p Displaced left trimalleolar ankle fracture  THERAPY DIAG:  Pain in left ankle and joints of left foot  Stiffness of left ankle, not elsewhere classified  Muscle weakness (generalized)  Other abnormalities of gait and mobility  Rationale for Evaluation and Treatment: Rehabilitation  ONSET DATE: DOS 12/03/22  SUBJECTIVE:   SUBJECTIVE STATEMENT: Pt reports that her Lt ankle has had more swelling.  She states that she has been elevating her LLE 3x/day (level with heart).  She has been doing her HEP 1x every other day.  She walks around house without boot, but wears it when out of house.   PERTINENT HISTORY: Recent c-section 10/11/22 PAIN:  Are you having pain? No current  PRECAUTIONS: None  WEIGHT BEARING RESTRICTIONS:  WBAT, wean boot per last MD visit  FALLS:  Has patient fallen in last 6 months? Yes. Number of falls 1  OCCUPATION: not currently employed   PLOF: Independent  PATIENT GOALS: get off the boot and walk normal   OBJECTIVE: (objective measures from initial evaluation unless otherwise dated) Observation: healing medial and lateral ankle incisions  DIAGNOSTIC FINDINGS: XR 12/16/22: IMPRESSION:ORIF of distal fibular and medial malleolar fractures without complication.  PATIENT SURVEYS:  LEFS 38/80  COGNITION: Overall cognitive status: Within functional limits for tasks assessed     SENSATION: WFL   POSTURE: No Significant postural limitations  PALPATION: Grossly TTP throughout L ankle   LOWER EXTREMITY ROM:  Active ROM Right eval Left eval Lt  10/19  Hip flexion     Hip extension     Hip  abduction     Hip adduction     Hip internal rotation     Hip external rotation     Knee flexion     Knee extension     Ankle dorsiflexion 6 Lacking 4 2  Ankle plantarflexion 57 31 42  Ankle inversion 30 22 25   Ankle eversion 8 5 7    (Blank rows = not tested) *= pain/symptoms  LOWER EXTREMITY MMT:  MMT Right eval Left eval  Hip flexion 5 4+  Hip extension    Hip abduction    Hip adduction    Hip internal rotation    Hip external rotation    Knee flexion 5 4 *  Knee extension 5 4+ *  Ankle dorsiflexion 5 4+*  Ankle plantarflexion    Ankle inversion 5 4*  Ankle eversion 5 4*   (Blank rows = not tested) *= pain/symptoms   FUNCTIONAL TESTS:  5 times sit to stand: 15.62 seconds without UE use and no boot  GAIT: Distance walked: 5 feet Assistive device utilized: None Level of assistance: Modified independence Comments: antalgic on LLE without boot, limited ankle mobility   TODAY'S TREATMENT:                                                                                                                              DATE: 01/04/23 Therapeutic exercise:  *NuStep x 2.5 min for ROM (slow pace) *Seated: ankle PF/DF with feet on 1/2 foam roller x 15 gentle inversion/eversion with LLE on 1/2 foam roller Lt ankle circles CW/CCW x 10 each Lt heel raises x 10; Lt toe raises x 10 Lt self stretch to toes into flex/ext Ankle inversion/eversion towel crunch  Lt gastroc stretch in long sitting x 30sec *Standing gentle gastroc stretch x 15s, gentle soleus stretch x 15s *Gait at counter with focus on heel strike and rolling through toes - forward/ backward, multiple reps * manual therapy: K-tape application in stirrup pattern crossing at talus, covering incisions (2 pieces) - pt instructed in safe tape removal method; verbalized understanding      PATIENT EDUCATION:  Education details: Person educated: Patient Education method: Programmer, multimedia, Facilities manager, and  Handouts Education comprehension: verbalized understanding, returned demonstration, verbal cues required, and tactile cues required  HOME EXERCISE PROGRAM: Access Code: [email or web address] URL: [email or web address]  Date: 12/24/2022 - Seated Ankle Pumps  - 3 x daily - 7 x weekly - 3 sets - 10 reps - Seated Ankle Circles  - 3 x daily -  7 x weekly - 3 sets - 10 reps - Seated Heel Toe Raises  - 3 x daily - 7 x weekly - 3 sets - 10 reps - Seated Heel Slide (Mirrored)  - 3 x daily - 7 x weekly - 10 reps - 10 second hold - Ankle Inversion Eversion Towel Slide  - 3 x daily - 7 x weekly - 3 sets - 10 reps  ASSESSMENT:  CLINICAL IMPRESSION: Pt is 4 wks 4 days s/p L ankle ORIF.  Her Lt ankle ROM has improved since evaluation.  Of note, her Rt ankle is limited in active DF.  She tolerated all exercises well, without any symptoms.  Encouraged pt to elevate LLE above heart, complete ROM exercises 3x/day and to wean from boot as instructed by dr.  Magdalen Spatz of kinesiotape to Lt ankle to assist with decreased edema and increased proprioception.  Goals are ongoing.  Therapist to ck STG next visit.    From initial evaluation:  Patient a 27 y.o. y.o. female who was seen today for physical therapy evaluation and treatment for s/p L ankle ORIF on 12/03/22. Patient presents with pain limited deficits in L ankle strength, ROM, endurance, activity tolerance, gait, balance, and functional mobility with ADL. Patient is having to modify and restrict ADL as indicated by outcome measure score as well as subjective information and objective measures which is affecting overall participation. Patient will benefit from skilled physical therapy in order to improve function and reduce impairment.  OBJECTIVE IMPAIRMENTS: Abnormal gait, decreased activity tolerance, decreased balance, decreased endurance, decreased mobility, difficulty walking, decreased ROM, decreased strength, increased edema, impaired flexibility, improper  body mechanics, and pain.   ACTIVITY LIMITATIONS: carrying, lifting, bending, standing, squatting, stairs, transfers, locomotion level, and caring for others  PARTICIPATION LIMITATIONS: meal prep, cleaning, laundry, shopping, community activity, occupation, and yard work  PERSONAL FACTORS:  c-section 10/11/22  are also affecting patient's functional outcome.   REHAB POTENTIAL: Good  CLINICAL DECISION MAKING: Stable/uncomplicated  EVALUATION COMPLEXITY: Low   GOALS: Goals reviewed with patient? Yes  SHORT TERM GOALS: Target date: 01/14/2023    Patient will be independent with HEP in order to improve functional outcomes. Baseline: Goal status: INITIAL  2.  Patient will report at least 25% improvement in symptoms for improved quality of life. Baseline: Goal status: INITIAL    LONG TERM GOALS: Target date: 02/04/2023    Patient will report at least 75% improvement in symptoms for improved quality of life. Baseline:  Goal status: INITIAL  2.  Patient will improve LEFS score by at least 9 points in order to indicate improved tolerance to activity. Baseline: 38/80 Goal status: INITIAL  3.  Patient will be able to navigate stairs with reciprocal pattern without compensation in order to demonstrate improved LE strength. Baseline:  Goal status: INITIAL  4.  Patient will demonstrate at least 10 degrees of L ankle DF for improved gait and stair mechanics. Baseline: lacking 4 at eval; 2  Goal status: In progress - 01/04/23  5.  Patient will be able to complete 5x STS in under 11.4 seconds in order to reduce the risk of falls. Baseline: 15.62 seconds without UE use and no boot Goal status: INITIAL  6.  Patient will demonstrate grade of 5/5 MMT grade in all tested musculature as evidence of improved strength to assist with stair ambulation and gait.   Baseline: see above Goal status: INITIAL   PLAN:  PT FREQUENCY: 2x/week  PT DURATION: 6 weeks  PLANNED INTERVENTIONS:  Therapeutic exercises, Therapeutic activity, Neuromuscular re-education, Balance training, Gait training, Patient/Family education, Joint manipulation, Joint mobilization, Stair training, Orthotic/Fit training, DME instructions, Aquatic Therapy, Dry Needling, Electrical stimulation, Spinal manipulation, Spinal mobilization, Cryotherapy, Moist heat, Compression bandaging, scar mobilization, Splintting, Taping, Traction, Ultrasound, Ionotophoresis 4mg /ml Dexamethasone, and Manual therapy  PLAN FOR NEXT SESSION: f/u with HEP, ankle mobility and strength, consider weight shifts without boot, standing exercises, etc. And progress as tolerated   Check all possible CPT codes: 62130 - PT Re-evaluation, 97110- Therapeutic Exercise, [phone]- Neuro Re-education, [phone] - Gait Training, [phone] - Manual Therapy, [phone] - Therapeutic Activities, [phone] - Self Care, and [phone] - Aquatic therapy    Check all conditions that are expected to impact treatment: {Conditions expected to impact treatment:Current pregnancy or recent postpartum   If treatment provided at initial evaluation, no treatment charged due to lack of authorization.

## 2023-01-13 ENCOUNTER — Ambulatory Visit (HOSPITAL_BASED_OUTPATIENT_CLINIC_OR_DEPARTMENT_OTHER): Payer: Medicaid Other | Admitting: Physical Therapy

## 2023-01-13 ENCOUNTER — Encounter (HOSPITAL_BASED_OUTPATIENT_CLINIC_OR_DEPARTMENT_OTHER): Payer: Self-pay | Admitting: Physical Therapy

## 2023-01-13 DIAGNOSIS — R2689 Other abnormalities of gait and mobility: Secondary | ICD-10-CM

## 2023-01-13 DIAGNOSIS — M25672 Stiffness of left ankle, not elsewhere classified: Secondary | ICD-10-CM

## 2023-01-13 DIAGNOSIS — S82432A Displaced oblique fracture of shaft of left fibula, initial encounter for closed fracture: Secondary | ICD-10-CM | POA: Diagnosis not present

## 2023-01-13 DIAGNOSIS — M6281 Muscle weakness (generalized): Secondary | ICD-10-CM

## 2023-01-13 DIAGNOSIS — M25572 Pain in left ankle and joints of left foot: Secondary | ICD-10-CM

## 2023-01-13 DIAGNOSIS — R29898 Other symptoms and signs involving the musculoskeletal system: Secondary | ICD-10-CM | POA: Diagnosis not present

## 2023-01-13 NOTE — Therapy (Signed)
OUTPATIENT PHYSICAL THERAPY LOWER EXTREMITY TREATMENT   Patient Name: Janet Rivers MRN: [id number] DOB:11-12-1995, 26 y.o., female Today's Date: 01/14/2023  END OF SESSION:  PT End of Session - 01/13/23 1248     Visit Number 3    Number of Visits 12    Date for PT Re-Evaluation 02/04/23    Authorization Type Medicaid healthy blue    PT Start Time 1230    PT Stop Time 1309    PT Time Calculation (min) 39 min    Activity Tolerance Patient tolerated treatment well    Behavior During Therapy WFL for tasks assessed/performed              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   Encounter for IUD insertion 04/12/2020   History of postpartum hemorrhage    Previous cesarean delivery, antepartum 12/15/2019   Rh negative state in antepartum period 12/15/2019   Past Surgical History:  Procedure Laterality Date   CESAREAN SECTION     CESAREAN SECTION N/A 02/19/2020   Procedure: CESAREAN SECTION;  Surgeon: [place] Bing, MD;  Location: [facility];  Service: Obstetrics;  Laterality: N/A;   CESAREAN SECTION WITH BILATERAL TUBAL LIGATION N/A 10/11/2022   Procedure: CESAREAN SECTION;  Surgeon: Lennart Pall, MD;  Location: [facility];  Service: Obstetrics;  Laterality: N/A;   COSMETIC SURGERY  08/2018   was going to have fat taken from belly to buttocks, but heart stopped x4 min with anesthesia, surgery in [place].   ORIF ANKLE FRACTURE Left 12/03/2022   Procedure: LEFT OPEN REDUCTION INTERNAL FIXATION (ORIF) ANKLE FRACTURE;  Surgeon: Huel Cote, MD;  Location: [facility];  Service: Orthopedics;  Laterality: Left;   Patient Active Problem List   Diagnosis Date Noted   Closed trimalleolar fracture of left ankle 12/03/2022   Rubella non-immune status, antepartum 02/21/2020   History of 3 cesarean sections 02/19/2020   History of  anesthesia reaction 01/29/2020   Rh negative state in antepartum period, first trimester 12/15/2019   History of genital warts 10/06/2019    PCP: No PCP  REFERRING PROVIDER: Huel Cote, MD  REFERRING DIAG: S82.432A (ICD-10-CM) - Closed displaced oblique fracture of shaft of left fibula, initial encounter;  ankle ORIF s/p Displaced left trimalleolar ankle fracture  THERAPY DIAG:  Pain in left ankle and joints of left foot  Stiffness of left ankle, not elsewhere classified  Muscle weakness (generalized)  Other abnormalities of gait and mobility  Rationale for Evaluation and Treatment: Rehabilitation  ONSET DATE: DOS 12/03/22  SUBJECTIVE:   SUBJECTIVE STATEMENT: The patient is out of the boot. She has had more swelling. She is not having much pain this morning.   PERTINENT HISTORY: Recent c-section 10/11/22 PAIN:  Are you having pain? No current  PRECAUTIONS: None  WEIGHT BEARING RESTRICTIONS:  WBAT, wean boot per last MD visit  FALLS:  Has patient fallen in last 6 months? Yes. Number of falls 1  OCCUPATION: not currently employed   PLOF: Independent  PATIENT GOALS: get off the boot and walk normal   OBJECTIVE: (objective measures from initial evaluation unless otherwise dated) Observation: healing medial and lateral ankle incisions   DIAGNOSTIC FINDINGS: XR 12/16/22: IMPRESSION:ORIF of distal fibular and medial malleolar fractures without complication.  PATIENT SURVEYS:  LEFS 38/80  COGNITION: Overall cognitive status: Within functional limits for tasks assessed  SENSATION: WFL   POSTURE: No Significant postural limitations  PALPATION: Grossly TTP throughout L ankle   LOWER EXTREMITY ROM:  Active ROM Right eval Left eval Lt  10/19 Lt  10/28  Hip flexion      Hip extension      Hip abduction      Hip adduction      Hip internal rotation      Hip external rotation      Knee flexion      Knee extension      Ankle dorsiflexion 6  Lacking 4 2 6    Ankle plantarflexion 57 31 42   Ankle inversion 30 22 25    Ankle eversion 8 5 7     (Blank rows = not tested) *= pain/symptoms  LOWER EXTREMITY MMT:  MMT Right eval Left eval  Hip flexion 5 4+  Hip extension    Hip abduction    Hip adduction    Hip internal rotation    Hip external rotation    Knee flexion 5 4 *  Knee extension 5 4+ *  Ankle dorsiflexion 5 4+*  Ankle plantarflexion    Ankle inversion 5 4*  Ankle eversion 5 4*   (Blank rows = not tested) *= pain/symptoms   FUNCTIONAL TESTS:  5 times sit to stand: 15.62 seconds without UE use and no boot  GAIT: Distance walked: 5 feet Assistive device utilized: None Level of assistance: Modified independence Comments: antalgic on LLE without boot, limited ankle mobility   TODAY'S TREATMENT:                                                                                                                              DATE:  10/28  Nu-step 5 min L3 for DF stretch   Manual: Gentle PROM into DF; edema massage; trigger point release to calf   Seated: ankle PF/DF with feet on 1/2 foam roller x 15 gentle inversion/eversion with LLE on 1/2 foam roller Lt ankle circles CW/CCW x 10 each Lt heel raises x 10; Lt toe raises x 10 Lt self stretch to toes into flex/ext Ankle inversion/eversion towel crunch   LAQ 2x15 red   Standing: Weight shifted 2x15  Slow march with UE control 3x10   Reviewed and updated HEP     01/04/23 Therapeutic exercise:  *NuStep x 2.5 min for ROM (slow pace) *Seated: ankle PF/DF with feet on 1/2 foam roller x 15 gentle inversion/eversion with LLE on 1/2 foam roller Lt ankle circles CW/CCW x 10 each Lt heel raises x 10; Lt toe raises x 10 Lt self stretch to toes into flex/ext Ankle inversion/eversion towel crunch  Lt gastroc stretch in long sitting x 30sec *Standing gentle gastroc stretch x 15s, gentle soleus stretch x 15s *Gait at counter with focus on heel strike and  rolling through toes - forward/ backward, multiple reps * manual therapy: K-tape application in stirrup pattern crossing at talus, covering incisions (2 pieces) -  pt instructed in safe tape removal method; verbalized understanding      PATIENT EDUCATION:  Education details: Person educated: Patient Education method: Programmer, multimedia, Facilities manager, and Handouts Education comprehension: verbalized understanding, returned demonstration, verbal cues required, and tactile cues required  HOME EXERCISE PROGRAM: Access Code: [email or web address] URL: [email or web address]  Date: 12/24/2022 - Seated Ankle Pumps  - 3 x daily - 7 x weekly - 3 sets - 10 reps - Seated Ankle Circles  - 3 x daily - 7 x weekly - 3 sets - 10 reps - Seated Heel Toe Raises  - 3 x daily - 7 x weekly - 3 sets - 10 reps - Seated Heel Slide (Mirrored)  - 3 x daily - 7 x weekly - 10 reps - 10 second hold - Ankle Inversion Eversion Towel Slide  - 3 x daily - 7 x weekly - 3 sets - 10 reps  ASSESSMENT:  CLINICAL IMPRESSION: The patient is making great progress. Her range has improved to 6 degrees of DF. She has a normal end feel. She was given standing exercises to work on at home. She had tenderness to palpation around the ports. She had no pain with ROM though. Therapy will continue to progress as tolerated.   From initial evaluation:  Patient a 27 y.o. y.o. female who was seen today for physical therapy evaluation and treatment for s/p L ankle ORIF on 12/03/22. Patient presents with pain limited deficits in L ankle strength, ROM, endurance, activity tolerance, gait, balance, and functional mobility with ADL. Patient is having to modify and restrict ADL as indicated by outcome measure score as well as subjective information and objective measures which is affecting overall participation. Patient will benefit from skilled physical therapy in order to improve function and reduce impairment.  OBJECTIVE IMPAIRMENTS: Abnormal gait,  decreased activity tolerance, decreased balance, decreased endurance, decreased mobility, difficulty walking, decreased ROM, decreased strength, increased edema, impaired flexibility, improper body mechanics, and pain.   ACTIVITY LIMITATIONS: carrying, lifting, bending, standing, squatting, stairs, transfers, locomotion level, and caring for others  PARTICIPATION LIMITATIONS: meal prep, cleaning, laundry, shopping, community activity, occupation, and yard work  PERSONAL FACTORS:  c-section 10/11/22  are also affecting patient's functional outcome.   REHAB POTENTIAL: Good  CLINICAL DECISION MAKING: Stable/uncomplicated  EVALUATION COMPLEXITY: Low   GOALS: Goals reviewed with patient? Yes  SHORT TERM GOALS: Target date: 01/14/2023    Patient will be independent with HEP in order to improve functional outcomes. Baseline: Goal status: INITIAL  2.  Patient will report at least 25% improvement in symptoms for improved quality of life. Baseline: Goal status: INITIAL    LONG TERM GOALS: Target date: 02/04/2023    Patient will report at least 75% improvement in symptoms for improved quality of life. Baseline:  Goal status: INITIAL  2.  Patient will improve LEFS score by at least 9 points in order to indicate improved tolerance to activity. Baseline: 38/80 Goal status: INITIAL  3.  Patient will be able to navigate stairs with reciprocal pattern without compensation in order to demonstrate improved LE strength. Baseline:  Goal status: INITIAL  4.  Patient will demonstrate at least 10 degrees of L ankle DF for improved gait and stair mechanics. Baseline: lacking 4 at eval; 2  Goal status: In progress - 01/04/23  5.  Patient will be able to complete 5x STS in under 11.4 seconds in order to reduce the risk of falls. Baseline: 15.62 seconds without UE use and no boot Goal status: INITIAL  6.  Patient will demonstrate grade of 5/5 MMT grade in all tested musculature as evidence  of improved strength to assist with stair ambulation and gait.   Baseline: see above Goal status: INITIAL   PLAN:  PT FREQUENCY: 2x/week  PT DURATION: 6 weeks  PLANNED INTERVENTIONS: Therapeutic exercises, Therapeutic activity, Neuromuscular re-education, Balance training, Gait training, Patient/Family education, Joint manipulation, Joint mobilization, Stair training, Orthotic/Fit training, DME instructions, Aquatic Therapy, Dry Needling, Electrical stimulation, Spinal manipulation, Spinal mobilization, Cryotherapy, Moist heat, Compression bandaging, scar mobilization, Splintting, Taping, Traction, Ultrasound, Ionotophoresis 4mg /ml Dexamethasone, and Manual therapy  PLAN FOR NEXT SESSION: f/u with HEP, ankle mobility and strength, consider weight shifts without boot, standing exercises, etc. And progress as tolerated   Check all possible CPT codes: 29528 - PT Re-evaluation, 97110- Therapeutic Exercise, [phone]- Neuro Re-education, [phone] - Gait Training, [phone] - Manual Therapy, 97530 - Therapeutic Activities, [phone] - Self Care, and [phone] - Aquatic therapy    Check all conditions that are expected to impact treatment: {Conditions expected to impact treatment:Current pregnancy or recent postpartum   If treatment provided at initial evaluation, no treatment charged due to lack of authorization.

## 2023-01-14 ENCOUNTER — Encounter (HOSPITAL_BASED_OUTPATIENT_CLINIC_OR_DEPARTMENT_OTHER): Payer: Self-pay | Admitting: Physical Therapy

## 2023-01-15 ENCOUNTER — Ambulatory Visit (HOSPITAL_BASED_OUTPATIENT_CLINIC_OR_DEPARTMENT_OTHER): Payer: Medicaid Other

## 2023-01-15 ENCOUNTER — Ambulatory Visit (HOSPITAL_BASED_OUTPATIENT_CLINIC_OR_DEPARTMENT_OTHER): Payer: Medicaid Other | Admitting: Orthopaedic Surgery

## 2023-01-15 DIAGNOSIS — S82432A Displaced oblique fracture of shaft of left fibula, initial encounter for closed fracture: Secondary | ICD-10-CM

## 2023-01-15 DIAGNOSIS — S82402D Unspecified fracture of shaft of left fibula, subsequent encounter for closed fracture with routine healing: Secondary | ICD-10-CM | POA: Diagnosis not present

## 2023-01-15 DIAGNOSIS — S8252XD Displaced fracture of medial malleolus of left tibia, subsequent encounter for closed fracture with routine healing: Secondary | ICD-10-CM | POA: Diagnosis not present

## 2023-01-15 NOTE — Progress Notes (Signed)
Post Operative Evaluation    Procedure/Date of Surgery: Left ankle open reduction internal fixation 9/17  Interval History:    Presents today for follow-up 6 weeks status post above procedure.  Overall she is doing very well.  She does have some occasional swelling although is now walking normally in normal shoe.  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   Encounter for IUD insertion 04/12/2020   History of postpartum hemorrhage    Previous cesarean delivery, antepartum 12/15/2019   Rh negative state in antepartum period 12/15/2019   Past Surgical History:  Procedure Laterality Date   CESAREAN SECTION     CESAREAN SECTION N/A 02/19/2020   Procedure: CESAREAN SECTION;  Surgeon: Balfour Bing, MD;  [facility];  Service: Obstetrics;  Laterality: N/A;   CESAREAN SECTION WITH BILATERAL TUBAL LIGATION N/A 10/11/2022   Procedure: CESAREAN SECTION;  Surgeon: Lennart Pall, MD;  Location: [facility];  Service: Obstetrics;  Laterality: N/A;   COSMETIC SURGERY  08/2018   was going to have fat taken from belly to buttocks, but heart stopped x4 min with anesthesia, surgery in [place].   ORIF ANKLE FRACTURE Left 12/03/2022   Procedure: LEFT OPEN REDUCTION INTERNAL FIXATION (ORIF) ANKLE FRACTURE;  Surgeon: Huel Cote, MD;  Location: [facility];  Service: Orthopedics;  Laterality: Left;   Social History   Socioeconomic History   Marital status: Married    Spouse name: Jose   Number of children: 1   Years of education: Not on file   Highest education level: High school graduate  Occupational History   Not on file  Tobacco Use   Smoking status: Never   Smokeless tobacco: Never  Vaping Use   Vaping status:  Never Used  Substance and Sexual Activity   Alcohol use: Not Currently    Comment: occ.    Drug use: No   Sexual activity: Yes    Birth control/protection: None  Other Topics Concern   Not on file  Social History Narrative   Not on file   Social Determinants of Health   Financial Resource Strain: Not on file  Food Insecurity: No Food Insecurity (10/11/2022)   Hunger Vital Sign    Worried About Running Out of Food in the Last Year: Never true    Ran Out of Food in the Last Year: Never true  Recent Concern: Food Insecurity - Food Insecurity Present (07/30/2022)   Hunger Vital Sign    Worried About Running Out of Food in the Last Year: Sometimes true    Ran Out of Food in the Last Year: Never true  Transportation Needs: No Transportation Needs (10/11/2022)   PRAPARE - Administrator, Civil Service (Medical): No    Lack of Transportation (Non-Medical): No  Physical Activity: Not on file  Stress: Not on file  Social Connections: Not on file   Family History  Problem Relation Age of Onset   Healthy Mother    Healthy Father    Cancer Paternal Aunt    No Known Allergies Current Outpatient Medications  Medication Sig Dispense Refill  norethindrone (ORTHO MICRONOR) 0.35 MG tablet Take 1 tablet (0.35 mg total) by mouth daily. 90 tablet 4   Prenatal Vit-Fe Fumarate-FA (PREPLUS) 27-1 MG TABS Take 1 tablet by mouth daily. 30 tablet 13   No current facility-administered medications for this visit.   No results found.  Review of Systems:   A ROS was performed including pertinent positives and negatives as documented in the HPI.   Musculoskeletal Exam:    not currently breastfeeding.  Left ankle incisions are well-appearing without erythema or drainage.  Trace swelling about the ankle.  00 degrees dorsi and plantarflexion about the ankle.  2+ dorsalis pedis pulse  Imaging:    3 views left ankle: Status post open reduction internal fixation without evidence of  hardware failure.  Healed ankle fracture  I personally reviewed and interpreted the radiographs.   Assessment:   6-week status post left ankle open reduction internal fixation overall doing very well.  She is continuing to make improvements.  I did discuss the the National recovery associated with ankle fractures.  We did discuss that some swelling is normal.  I will plan to see her back in 6 weeks for final check  -Return up in 6 weeks for reassessment      I personally saw and evaluated the patient, and participated in the management and treatment plan.  Huel Cote, MD Attending Physician, Orthopedic Surgery  This document was dictated using Dragon voice recognition software. A reasonable attempt at proof reading has been made to minimize errors.

## 2023-01-16 ENCOUNTER — Telehealth (HOSPITAL_BASED_OUTPATIENT_CLINIC_OR_DEPARTMENT_OTHER): Payer: Self-pay | Admitting: Physical Therapy

## 2023-01-16 ENCOUNTER — Ambulatory Visit (HOSPITAL_BASED_OUTPATIENT_CLINIC_OR_DEPARTMENT_OTHER): Payer: Medicaid Other | Admitting: Physical Therapy

## 2023-01-16 NOTE — Telephone Encounter (Signed)
Patient no show, left message for patient to make her aware of missed appointment and reminded of next appointment.  3:23 PM, 01/16/23 Wyman Songster PT, DPT Physical Therapist at [facility]

## 2023-01-20 ENCOUNTER — Ambulatory Visit (HOSPITAL_BASED_OUTPATIENT_CLINIC_OR_DEPARTMENT_OTHER): Payer: Medicaid Other | Admitting: Physical Therapy

## 2023-01-23 ENCOUNTER — Ambulatory Visit (HOSPITAL_BASED_OUTPATIENT_CLINIC_OR_DEPARTMENT_OTHER): Payer: Medicaid Other | Attending: Orthopaedic Surgery | Admitting: Physical Therapy

## 2023-01-23 DIAGNOSIS — M25572 Pain in left ankle and joints of left foot: Secondary | ICD-10-CM | POA: Insufficient documentation

## 2023-01-23 DIAGNOSIS — R2689 Other abnormalities of gait and mobility: Secondary | ICD-10-CM | POA: Insufficient documentation

## 2023-01-23 DIAGNOSIS — M6281 Muscle weakness (generalized): Secondary | ICD-10-CM | POA: Insufficient documentation

## 2023-01-23 DIAGNOSIS — R29898 Other symptoms and signs involving the musculoskeletal system: Secondary | ICD-10-CM | POA: Insufficient documentation

## 2023-01-23 DIAGNOSIS — S82432A Displaced oblique fracture of shaft of left fibula, initial encounter for closed fracture: Secondary | ICD-10-CM | POA: Insufficient documentation

## 2023-01-23 DIAGNOSIS — M25672 Stiffness of left ankle, not elsewhere classified: Secondary | ICD-10-CM | POA: Insufficient documentation

## 2023-01-28 ENCOUNTER — Encounter (HOSPITAL_BASED_OUTPATIENT_CLINIC_OR_DEPARTMENT_OTHER): Payer: Self-pay | Admitting: Physical Therapy

## 2023-01-28 ENCOUNTER — Telehealth (HOSPITAL_BASED_OUTPATIENT_CLINIC_OR_DEPARTMENT_OTHER): Payer: Self-pay | Admitting: Physical Therapy

## 2023-01-28 ENCOUNTER — Other Ambulatory Visit (HOSPITAL_COMMUNITY)
Admission: RE | Admit: 2023-01-28 | Discharge: 2023-01-28 | Disposition: A | Discharge status: Home / Self Care | Payer: Medicaid Other | Source: Ambulatory Visit | Attending: Family Medicine | Admitting: Family Medicine

## 2023-01-28 ENCOUNTER — Other Ambulatory Visit: Payer: Self-pay

## 2023-01-28 ENCOUNTER — Ambulatory Visit (HOSPITAL_BASED_OUTPATIENT_CLINIC_OR_DEPARTMENT_OTHER): Payer: Medicaid Other | Admitting: Physical Therapy

## 2023-01-28 ENCOUNTER — Encounter: Payer: Self-pay | Admitting: Family Medicine

## 2023-01-28 ENCOUNTER — Ambulatory Visit: Payer: Medicaid Other | Admitting: Family Medicine

## 2023-01-28 VITALS — BP 96/68 | HR 88 | Ht 64.0 in | Wt 136.5 lb

## 2023-01-28 DIAGNOSIS — Z113 Encounter for screening for infections with a predominantly sexual mode of transmission: Secondary | ICD-10-CM | POA: Insufficient documentation

## 2023-01-28 DIAGNOSIS — Z124 Encounter for screening for malignant neoplasm of cervix: Secondary | ICD-10-CM | POA: Insufficient documentation

## 2023-01-28 DIAGNOSIS — N941 Unspecified dyspareunia: Secondary | ICD-10-CM | POA: Diagnosis not present

## 2023-01-28 NOTE — Progress Notes (Signed)
GYNECOLOGY OFFICE VISIT NOTE  History:   Janet Rivers is a 27 y.o. [phone] here today for repeat pap.  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   Encounter for IUD insertion 04/12/2020   History of postpartum hemorrhage    Previous cesarean delivery, antepartum 12/15/2019   Rh negative state in antepartum period 12/15/2019    Past Surgical History:  Procedure Laterality Date   CESAREAN SECTION     CESAREAN SECTION N/A 02/19/2020   Procedure: CESAREAN SECTION;  Surgeon: [place] Bing, MD;  [facility]: [facility];  Service: Obstetrics;  Laterality: N/A;   CESAREAN SECTION WITH BILATERAL TUBAL LIGATION N/A 10/11/2022   Procedure: CESAREAN SECTION;  Surgeon: Lennart Pall, MD;  [facility]: [facility];  Service: Obstetrics;  Laterality: N/A;   COSMETIC SURGERY  08/2018   was going to have fat taken from belly to buttocks, but heart stopped x4 min with anesthesia, surgery in [place].   ORIF ANKLE FRACTURE Left 12/03/2022   Procedure: LEFT OPEN REDUCTION INTERNAL FIXATION (ORIF) ANKLE FRACTURE;  Surgeon: Huel Cote, MD;  Location:  SURGERY CENTER;  Service: Orthopedics;  Laterality: Left;    The following portions of the patient's history were reviewed and updated as appropriate: allergies, current medications, past family history, past medical history, past social history, past surgical history and problem list.   Health Maintenance:   Last pap: Lab Results  Component Value Date    DIAGPAP  10/21/2019    - Negative for intraepithelial lesion or malignancy (NILM)    Last mammogram:  N/a    Review of Systems:  Pertinent items noted in HPI and remainder of comprehensive ROS otherwise negative.  Physical Exam:  BP 96/68   Pulse 88   Ht 5\' 4"  (1.626 m)   Wt 136 lb 8 oz (61.9 kg)   LMP 12/25/2022 (Exact Date)   Breastfeeding No   BMI 23.43 kg/m  CONSTITUTIONAL: Well-developed, well-nourished female in no acute distress.  HEENT:  Normocephalic, atraumatic. External right and left ear normal. No scleral icterus.  NECK: Normal range of motion, supple, no masses noted on observation SKIN: No rash noted. Not diaphoretic. No erythema. No pallor. MUSCULOSKELETAL: Normal range of motion. No edema noted. NEUROLOGIC: Alert and oriented to person, place, and time. Normal muscle tone coordination.  PSYCHIATRIC: Normal mood and affect. Normal behavior. Normal judgment and thought content. RESPIRATORY: Effort normal, no problems with respiration noted ABDOMEN: No masses noted. No other overt distention noted.   PELVIC: Normal appearing external genitalia; normal appearing vaginal mucosa and cervix.  No abnormal discharge noted.  Labs and Imaging No results found for this or any previous visit (from the past 168 hour(s)). No results found.    Assessment and Plan:   Problem List Items Addressed This Visit       Other   Dyspareunia in female  Discussed with patient, has noticed lubrication is sometimes an issue, discussed addressing this and seeing if there is any evidence of vaginitis on  tests today. If persistent despite adequate lubrication and no infections then would pursue further workup, she is amenable to this plan.     Other Visit Diagnoses     Screening for cervical cancer    -  Primary   Relevant Orders   Cytology - PAP( [facility])  Pap collected. Discussed breast exams are not recommended, especially in absence of any specific concerning findings  from the patient.     Screen for sexually transmitted diseases       Relevant Orders   Cytology - PAP( [place])   Hepatitis B Surface AntiGEN   Hepatitis C Antibody   HIV Antibody (routine testing w rflx)   RPR       Routine preventative health maintenance measures emphasized. Please refer to After Visit Summary for other counseling recommendations.   Return in about 1 year (around 01/28/2024) for Annual Wellness Visit.    Total face-to-face time with patient: 20 minutes.  Over 50% of encounter was spent on counseling and coordination of care.   Venora Maples, MD/MPH Attending Family Medicine Physician, [facility] for [facility], [facility]

## 2023-01-28 NOTE — Therapy (Signed)
[facility] Outpatient Rehabilitation at [facility] [address] [place], [place], [address] Phone: [phone]   Fax:  [phone]  Patient Details  Name: Janet Rivers MRN: [id number] Date of Birth: 05/10/1995 Referring Provider:  No ref. provider found  Encounter Date: 01/28/2023  PHYSICAL THERAPY DISCHARGE SUMMARY  Visits from Start of Care: 3  Current functional level related to goals / functional outcomes: Unknown as patient has not returned. Being discharged due to attendance policy with several no show/ late cancels.    Remaining deficits: Unknown as patient has not returned.   Education / Equipment: HEP   Patient agrees to discharge. Patient goals were not met. Patient is being discharged due to not returning since the last visit.  10:57 AM, 01/28/23 Wyman Songster PT, DPT Physical Therapist at [facility] Outpatient Rehabilitation at [place] [address] [place], [place], [address] Phone: [phone]   Fax:  [phone]

## 2023-01-28 NOTE — Telephone Encounter (Signed)
Patient no show, spoke with patient regarding missed appointment and she stated she has been missing appointments due to other appointments for herself and her kids. Educated patient on clinic attendance policy and that she would be discharged due to attendance policy with several no show/late cancels. Educated patient on new referral if she would like to attend PT.   10:54 AM, 01/28/23 Wyman Songster PT, DPT Physical Therapist at [facility]

## 2023-01-29 LAB — HEPATITIS B SURFACE ANTIGEN: Hepatitis B Surface Ag: NEGATIVE

## 2023-01-29 LAB — HIV ANTIBODY (ROUTINE TESTING W REFLEX): HIV Screen 4th Generation wRfx: NONREACTIVE

## 2023-01-29 LAB — HEPATITIS C ANTIBODY: Hep C Virus Ab: NONREACTIVE

## 2023-01-29 LAB — RPR: RPR Ser Ql: NONREACTIVE

## 2023-01-30 ENCOUNTER — Encounter (HOSPITAL_BASED_OUTPATIENT_CLINIC_OR_DEPARTMENT_OTHER): Payer: Medicaid Other | Admitting: Physical Therapy

## 2023-01-31 LAB — CYTOLOGY - PAP
Chlamydia: NEGATIVE
Comment: NEGATIVE
Comment: NEGATIVE
Comment: NORMAL
Diagnosis: NEGATIVE
Neisseria Gonorrhea: NEGATIVE
Trichomonas: NEGATIVE

## 2023-02-04 ENCOUNTER — Encounter (HOSPITAL_BASED_OUTPATIENT_CLINIC_OR_DEPARTMENT_OTHER): Payer: Medicaid Other | Admitting: Physical Therapy

## 2023-02-06 ENCOUNTER — Encounter (HOSPITAL_BASED_OUTPATIENT_CLINIC_OR_DEPARTMENT_OTHER): Payer: Medicaid Other

## 2023-02-10 ENCOUNTER — Encounter (HOSPITAL_BASED_OUTPATIENT_CLINIC_OR_DEPARTMENT_OTHER): Payer: Medicaid Other | Admitting: Physical Therapy

## 2023-02-28 ENCOUNTER — Ambulatory Visit (HOSPITAL_BASED_OUTPATIENT_CLINIC_OR_DEPARTMENT_OTHER): Payer: Medicaid Other | Admitting: Orthopaedic Surgery

## 2023-02-28 ENCOUNTER — Ambulatory Visit (HOSPITAL_BASED_OUTPATIENT_CLINIC_OR_DEPARTMENT_OTHER): Payer: Medicaid Other

## 2023-02-28 DIAGNOSIS — S82432A Displaced oblique fracture of shaft of left fibula, initial encounter for closed fracture: Secondary | ICD-10-CM

## 2023-02-28 NOTE — Progress Notes (Signed)
Post Operative Evaluation    Procedure/Date of Surgery: Left ankle open reduction internal fixation 9/17  Interval History:    Presents today for follow-up 12 weeks overall doing extremely well.  She now has no pain.  She is walking normally.  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   Encounter for IUD insertion 04/12/2020   History of postpartum hemorrhage    Previous cesarean delivery, antepartum 12/15/2019   Rh negative state in antepartum period 12/15/2019   Past Surgical History:  Procedure Laterality Date   CESAREAN SECTION     CESAREAN SECTION N/A 02/19/2020   Procedure: CESAREAN SECTION;  Surgeon: [place] Bing, MD;  [facility]: [facility];  Service: Obstetrics;  Laterality: N/A;   CESAREAN SECTION WITH BILATERAL TUBAL LIGATION N/A 10/11/2022   Procedure: CESAREAN SECTION;  Surgeon: Lennart Pall, MD;  [facility];  Service: Obstetrics;  Laterality: N/A;   COSMETIC SURGERY  08/2018   was going to have fat taken from belly to buttocks, but heart stopped x4 min with anesthesia, surgery in [place].   ORIF ANKLE FRACTURE Left 12/03/2022   Procedure: LEFT OPEN REDUCTION INTERNAL FIXATION (ORIF) ANKLE FRACTURE;  Surgeon: Huel Cote, MD;  Location: [facility];  Service: Orthopedics;  Laterality: Left;   Social History   Socioeconomic History   Marital status: Married    Spouse name: Jose   Number of children: 1   Years of education: Not on file   Highest education level: High school graduate  Occupational History   Not on file  Tobacco Use   Smoking status: Never   Smokeless tobacco: Never  Vaping Use   Vaping status: Never Used  Substance and Sexual Activity   Alcohol use: Not Currently    Comment: occ.    Drug use: No    Sexual activity: Yes    Birth control/protection: Injection  Other Topics Concern   Not on file  Social History Narrative   Not on file   Social Drivers of Health   Financial Resource Strain: Not on file  Food Insecurity: Food Insecurity Present (01/28/2023)   Hunger Vital Sign    Worried About Running Out of Food in the Last Year: Sometimes true    Ran Out of Food in the Last Year: Never true  Transportation Needs: Patient Declined (01/28/2023)   PRAPARE - Administrator, Civil Service (Medical): Patient declined    Lack of Transportation (Non-Medical): Patient declined  Physical Activity: Not on file  Stress: Not on file  Social Connections: Not on file   Family History  Problem Relation Age of Onset   Healthy Mother    Healthy Father    Cancer Paternal Aunt    No Known Allergies Current Outpatient Medications  Medication Sig Dispense Refill   norethindrone (ORTHO MICRONOR) 0.35 MG tablet Take 1 tablet (0.35 mg total) by mouth daily. 90 tablet 4   Prenatal Vit-Fe Fumarate-FA (PREPLUS) 27-1 MG TABS Take 1 tablet by mouth daily. 30 tablet 13   No current facility-administered medications for this visit.   No results found.  Review of Systems:   A ROS was performed including  pertinent positives and negatives as documented in the HPI.   Musculoskeletal Exam:    not currently breastfeeding.  Left ankle incisions are well-appearing without erythema or drainage.  Trace swelling about the ankle.  00 degrees dorsi and plantarflexion about the ankle.  2+ dorsalis pedis pulse  Imaging:    3 views left ankle: Status post open reduction internal fixation without evidence of hardware failure.  Healed ankle fracture  I personally reviewed and interpreted the radiographs.   Assessment:   6-week status post left ankle open reduction internal fixation overall doing very well.  At this time I will plan to see her back as needed  -Return to clinic as  needed      I personally saw and evaluated the patient, and participated in the management and treatment plan.  Huel Cote, MD Attending Physician, Orthopedic Surgery  This document was dictated using Dragon voice recognition software. A reasonable attempt at proof reading has been made to minimize errors.

## 2023-03-18 ENCOUNTER — Ambulatory Visit: Payer: Medicaid Other

## 2023-03-18 NOTE — Progress Notes (Unsigned)
 Janet Rivers here for Depo-Provera Injection. Injection administered without complication. Patient will return in 3 months for next injection between *** and ***. Next annual visit due ***.   Lowry Bowl, CMA 03/18/2023  1:35 PM

## 2023-03-26 ENCOUNTER — Other Ambulatory Visit: Payer: Self-pay

## 2023-03-26 ENCOUNTER — Ambulatory Visit (INDEPENDENT_AMBULATORY_CARE_PROVIDER_SITE_OTHER): Payer: Medicaid Other

## 2023-03-26 VITALS — BP 110/73 | HR 97 | Ht 64.0 in | Wt 148.0 lb

## 2023-03-26 DIAGNOSIS — Z3042 Encounter for surveillance of injectable contraceptive: Secondary | ICD-10-CM | POA: Diagnosis not present

## 2023-03-26 MED ORDER — MEDROXYPROGESTERONE ACETATE 150 MG/ML IM SUSY
150.0000 mg | PREFILLED_SYRINGE | Freq: Once | INTRAMUSCULAR | Status: AC
  Administered 2023-03-26: 150 mg via INTRAMUSCULAR

## 2023-03-26 NOTE — Progress Notes (Signed)
 Janet Rivers here for Depo-Provera  Injection. Injection administered without complication. Patient will return in 3 months for next injection between March 26 and April 9. Next annual visit due 02/2024.   Damontae Loppnow, RN 03/26/2023  2:22 PM

## 2023-06-12 ENCOUNTER — Ambulatory Visit: Payer: Medicaid Other | Admitting: General Practice

## 2023-06-12 ENCOUNTER — Other Ambulatory Visit: Payer: Self-pay

## 2023-06-12 DIAGNOSIS — Z3042 Encounter for surveillance of injectable contraceptive: Secondary | ICD-10-CM

## 2023-06-12 DIAGNOSIS — Z3009 Encounter for other general counseling and advice on contraception: Secondary | ICD-10-CM

## 2023-06-12 MED ORDER — MEDROXYPROGESTERONE ACETATE 150 MG/ML IM SUSY: 150.0000 mg | PREFILLED_SYRINGE | Freq: Once | INTRAMUSCULAR | Status: DC

## 2023-06-12 NOTE — Progress Notes (Signed)
 Patient presents to office today for depo provera injection appt. States she doesn't want to proceed with injection today as she is concerned about the weight she has gained with depo. Reviewed birth control options with patient and encouraged her to make a follow up appt with a provider. Recommended condoms in the meantime.   Chase Caller RN BSN 06/12/23

## 2023-07-15 ENCOUNTER — Ambulatory Visit: Admitting: Advanced Practice Midwife

## 2023-08-18 ENCOUNTER — Other Ambulatory Visit: Payer: Self-pay

## 2023-08-18 ENCOUNTER — Ambulatory Visit: Admitting: Family Medicine

## 2023-08-18 VITALS — BP 95/64 | HR 111 | Wt 151.5 lb

## 2023-08-18 DIAGNOSIS — Z3202 Encounter for pregnancy test, result negative: Secondary | ICD-10-CM | POA: Diagnosis not present

## 2023-08-18 DIAGNOSIS — Z30015 Encounter for initial prescription of vaginal ring hormonal contraceptive: Secondary | ICD-10-CM

## 2023-08-18 LAB — POCT PREGNANCY, URINE: Preg Test, Ur: NEGATIVE

## 2023-08-18 MED ORDER — ETONOGESTREL-ETHINYL ESTRADIOL 0.12-0.015 MG/24HR VA RING: VAGINAL_RING | VAGINAL | 12 refills | Status: AC

## 2023-08-18 NOTE — Progress Notes (Signed)
 GYNECOLOGY OFFICE VISIT NOTE  History:    Janet Rivers is a 28 y.o. [phone] here today for contraception discussions.  Pt no longer wants Depo 2/2 weight gain, not interested in IUD or nexplanon, no migraine hx or clot hx in her personally or family. She denies any abnormal vaginal discharge, bleeding, pelvic pain or other concerns.    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   Encounter for IUD insertion 04/12/2020   History of postpartum hemorrhage    Previous cesarean delivery, antepartum 12/15/2019   Rh negative state in antepartum period 12/15/2019    Past Surgical History:  Procedure Laterality Date   CESAREAN SECTION     CESAREAN SECTION N/A 02/19/2020   Procedure: CESAREAN SECTION;  Surgeon: Raynell Caller, MD;  [facility];  Service: Obstetrics;  Laterality: N/A;   CESAREAN SECTION WITH BILATERAL TUBAL LIGATION N/A 10/11/2022   Procedure: CESAREAN SECTION;  Surgeon: Izell Marsh, MD;  [facility]: [facility];  Service: Obstetrics;  Laterality: N/A;   COSMETIC SURGERY  08/2018   was going to have fat taken from belly to buttocks, but heart stopped x4 min with anesthesia, surgery in [place].   ORIF ANKLE FRACTURE Left 12/03/2022   Procedure: LEFT OPEN REDUCTION INTERNAL FIXATION (ORIF) ANKLE FRACTURE;  Surgeon: Wilhelmenia Harada, MD;  Location: [facility];  Service: Orthopedics;  Laterality: Left;    The following portions of the patient's history were reviewed and updated as appropriate: allergies, current medications, past family history, past medical history, past social history, past surgical history and problem list.   Health Maintenance:  Normal pap on 01/28/2023.     Review of Systems:  Pertinent items noted in HPI and remainder of comprehensive ROS otherwise negative.  Physical Exam:  BP 95/64   Pulse (!) 111    Wt 151 lb 8 oz (68.7 kg)   LMP 08/11/2023   BMI 26.00 kg/m  CONSTITUTIONAL: Well-developed, well-nourished female in no acute distress.  HEENT:  Normocephalic, atraumatic. External right and left ear normal. No scleral icterus.  NECK: Normal range of motion, supple, no masses noted on observation SKIN: No rash noted. Not diaphoretic. No erythema. No pallor. MUSCULOSKELETAL: Normal range of motion. No edema noted. NEUROLOGIC: Alert and oriented to person, place, and time. Normal muscle tone coordination.  PSYCHIATRIC: Normal mood and affect. Normal behavior. Normal judgment and thought content. CARDIOVASCULAR: Normal heart rate noted RESPIRATORY: Effort and breath sounds normal, no problems with respiration noted ABDOMEN: No masses noted. No other overt distention noted.   PELVIC: Deferred  Labs and Imaging Results for orders placed or performed in visit on 08/18/23 (from the past week)  Pregnancy, urine POC   Collection Time: 08/18/23  2:26 PM  Result Value Ref Range   Preg Test, Ur NEGATIVE NEGATIVE   No results found.    Assessment and Plan:      1. Encounter for initial prescription of vaginal ring hormonal contraceptive (Primary) - etonogestrel-ethinyl estradiol (NUVARING) 0.12-0.015 MG/24HR vaginal ring; Insert vaginally and leave in place for 3 consecutive weeks, then remove for 1 week.  Dispense: 1 each; Refill: 12 - Pregnancy, urine POC  Upreg negative  Routine preventative health maintenance measures emphasized. Please refer to After Visit Summary for other counseling recommendations.   Return if symptoms worsen or fail to improve.     Ebony Goldstein, MD OB  Fellow, Faculty Practice [facility], Center for [facility] 08/18/2023 3:28 PM

## 2023-11-10 ENCOUNTER — Telehealth: Payer: Self-pay

## 2023-11-10 NOTE — Telephone Encounter (Signed)
 Called patient and informed her that our office is not accepting new patients and does not provide care for toddlers or infants.

## 2023-11-10 NOTE — Telephone Encounter (Signed)
 Copied from CRM #[id number]. Topic: Appointments - Scheduling Inquiry for Clinic >> Nov 10, 2023  3:56 PM Janet Rivers wrote:  Reason for CRM: Pt called to inquire on free vaccinations for her child, she was told that it could be possible that child could get her vaccinations needed before going into school for free at this clinic, please call her back at [phone] to inform her if this is possible.

## 2024-01-19 ENCOUNTER — Encounter: Payer: Self-pay | Admitting: Radiology
